# Patient Record
Sex: Male | Born: 1955 | Race: White | Hispanic: No | Marital: Single | Smoking: Former smoker
Health system: Southern US, Community
[De-identification: ages and names within clinical notes are randomized; demographics above are authoritative.]

## PROBLEM LIST (undated history)

## (undated) DIAGNOSIS — I739 Peripheral vascular disease, unspecified: Secondary | ICD-10-CM

## (undated) DIAGNOSIS — E785 Hyperlipidemia, unspecified: Secondary | ICD-10-CM

## (undated) DIAGNOSIS — I2510 Atherosclerotic heart disease of native coronary artery without angina pectoris: Secondary | ICD-10-CM

## (undated) DIAGNOSIS — K219 Gastro-esophageal reflux disease without esophagitis: Secondary | ICD-10-CM

## (undated) DIAGNOSIS — I4892 Unspecified atrial flutter: Secondary | ICD-10-CM

## (undated) DIAGNOSIS — I10 Essential (primary) hypertension: Secondary | ICD-10-CM

## (undated) DIAGNOSIS — E119 Type 2 diabetes mellitus without complications: Secondary | ICD-10-CM

## (undated) DIAGNOSIS — G4733 Obstructive sleep apnea (adult) (pediatric): Secondary | ICD-10-CM

## (undated) DIAGNOSIS — E669 Obesity, unspecified: Secondary | ICD-10-CM

## (undated) DIAGNOSIS — Z8719 Personal history of other diseases of the digestive system: Secondary | ICD-10-CM

## (undated) DIAGNOSIS — C9500 Acute leukemia of unspecified cell type not having achieved remission: Secondary | ICD-10-CM

## (undated) DIAGNOSIS — I639 Cerebral infarction, unspecified: Secondary | ICD-10-CM

## (undated) HISTORY — PX: NASAL SINUS SURGERY: SHX719

## (undated) HISTORY — DX: Type 2 diabetes mellitus without complications: E11.9

## (undated) HISTORY — DX: Atherosclerotic heart disease of native coronary artery without angina pectoris: I25.10

## (undated) HISTORY — DX: Unspecified atrial flutter: I48.92

## (undated) HISTORY — DX: Obstructive sleep apnea (adult) (pediatric): G47.33

## (undated) HISTORY — DX: Cerebral infarction, unspecified: I63.9

## (undated) HISTORY — DX: Essential (primary) hypertension: I10

## (undated) HISTORY — DX: Acute leukemia of unspecified cell type not having achieved remission: C95.00

## (undated) HISTORY — PX: GASTRIC BYPASS: SHX52

## (undated) HISTORY — DX: Hyperlipidemia, unspecified: E78.5

## (undated) HISTORY — DX: Peripheral vascular disease, unspecified: I73.9

---

## 1996-04-21 HISTORY — PX: ELBOW SURGERY: SHX618

## 2005-10-14 ENCOUNTER — Encounter: Admission: RE | Admit: 2005-10-14 | Discharge: 2005-10-14 | Payer: Self-pay

## 2013-07-20 HISTORY — PX: CARDIAC CATHETERIZATION: SHX172

## 2013-11-04 ENCOUNTER — Emergency Department: Payer: Self-pay | Admitting: Emergency Medicine

## 2013-11-04 LAB — URINALYSIS, COMPLETE
BLOOD: NEGATIVE
Bacteria: NONE SEEN
Bilirubin,UR: NEGATIVE
Ketone: NEGATIVE
NITRITE: NEGATIVE
PH: 6 (ref 4.5–8.0)
Protein: NEGATIVE
SPECIFIC GRAVITY: 1.023 (ref 1.003–1.030)

## 2013-11-04 LAB — DRUG SCREEN, URINE

## 2013-11-04 LAB — CBC
HCT: 43.6 % (ref 40.0–52.0)
HGB: 14.6 g/dL (ref 13.0–18.0)
MCH: 33.3 pg (ref 26.0–34.0)
MCHC: 33.4 g/dL (ref 32.0–36.0)
MCV: 100 fL (ref 80–100)
Platelet: 140 10*3/uL — ABNORMAL LOW (ref 150–440)
RBC: 4.37 10*6/uL — AB (ref 4.40–5.90)
RDW: 14 % (ref 11.5–14.5)
WBC: 17.3 10*3/uL — ABNORMAL HIGH (ref 3.8–10.6)

## 2013-11-04 LAB — COMPREHENSIVE METABOLIC PANEL
ALK PHOS: 67 U/L
AST: 31 U/L (ref 15–37)
Albumin: 3 g/dL — ABNORMAL LOW (ref 3.4–5.0)
Anion Gap: 6 — ABNORMAL LOW (ref 7–16)
BUN: 9 mg/dL (ref 7–18)
Bilirubin,Total: 1.3 mg/dL — ABNORMAL HIGH (ref 0.2–1.0)
CHLORIDE: 104 mmol/L (ref 98–107)
Calcium, Total: 7.9 mg/dL — ABNORMAL LOW (ref 8.5–10.1)
Co2: 26 mmol/L (ref 21–32)
Creatinine: 1.07 mg/dL (ref 0.60–1.30)
EGFR (African American): >60
GLUCOSE: 310 mg/dL — AB (ref 65–99)
Osmolality: 282 (ref 275–301)
POTASSIUM: 4 mmol/L (ref 3.5–5.1)
SGPT (ALT): 29 U/L (ref 12–78)
Sodium: 136 mmol/L (ref 136–145)
Total Protein: 6.6 g/dL (ref 6.4–8.2)

## 2013-11-04 LAB — TROPONIN I: Troponin-I: 0.03 ng/mL

## 2013-11-04 LAB — ETHANOL
Ethanol %: <0.003 % (ref 0.000–0.080)
Ethanol: <3 mg/dL

## 2014-02-07 ENCOUNTER — Encounter: Payer: Self-pay | Admitting: Internal Medicine

## 2014-02-07 ENCOUNTER — Encounter: Payer: Self-pay | Admitting: *Deleted

## 2014-02-07 ENCOUNTER — Emergency Department: Payer: Self-pay | Admitting: Student

## 2014-02-07 ENCOUNTER — Ambulatory Visit (INDEPENDENT_AMBULATORY_CARE_PROVIDER_SITE_OTHER): Payer: Non-veteran care | Admitting: Internal Medicine

## 2014-02-07 ENCOUNTER — Encounter (INDEPENDENT_AMBULATORY_CARE_PROVIDER_SITE_OTHER): Payer: Self-pay

## 2014-02-07 VITALS — BP 140/82 | HR 77 | Ht 72.0 in | Wt 345.2 lb

## 2014-02-07 DIAGNOSIS — I4892 Unspecified atrial flutter: Secondary | ICD-10-CM

## 2014-02-07 LAB — COMPREHENSIVE METABOLIC PANEL
ALK PHOS: 75 U/L
ALT: 28 U/L
AST: 26 U/L (ref 15–37)
Albumin: 3.2 g/dL — ABNORMAL LOW (ref 3.4–5.0)
Anion Gap: 7 (ref 7–16)
BUN: 7 mg/dL (ref 7–18)
Bilirubin,Total: 0.5 mg/dL (ref 0.2–1.0)
CALCIUM: 8.3 mg/dL — AB (ref 8.5–10.1)
CO2: 31 mmol/L (ref 21–32)
Chloride: 103 mmol/L (ref 98–107)
Creatinine: 1.08 mg/dL (ref 0.60–1.30)
EGFR (African American): >60
EGFR (Non-African Amer.): >60
Glucose: 122 mg/dL — ABNORMAL HIGH (ref 65–99)
Osmolality: 281 (ref 275–301)
POTASSIUM: 4.1 mmol/L (ref 3.5–5.1)
SODIUM: 141 mmol/L (ref 136–145)
Total Protein: 6.9 g/dL (ref 6.4–8.2)

## 2014-02-07 LAB — CBC
HCT: 46.4 % (ref 40.0–52.0)
HGB: 15.4 g/dL (ref 13.0–18.0)
MCH: 32.5 pg (ref 26.0–34.0)
MCHC: 33.2 g/dL (ref 32.0–36.0)
MCV: 98 fL (ref 80–100)
Platelet: 153 10*3/uL (ref 150–440)
RBC: 4.75 10*6/uL (ref 4.40–5.90)
RDW: 14.6 % — AB (ref 11.5–14.5)
WBC: 20 10*3/uL — AB (ref 3.8–10.6)

## 2014-02-07 LAB — PROTIME-INR
INR: 3.2
PROTHROMBIN TIME: 31.9 s — AB (ref 11.5–14.7)

## 2014-02-07 LAB — TROPONIN I
Troponin-I: <0.02 ng/mL
Troponin-I: <0.02 ng/mL

## 2014-02-07 LAB — APTT: ACTIVATED PTT: 44 s — AB (ref 23.6–35.9)

## 2014-02-07 MED ORDER — FUROSEMIDE 80 MG PO TABS: 80.0000 mg | ORAL_TABLET | Freq: Every day | ORAL | Status: DC

## 2014-02-07 NOTE — Patient Instructions (Addendum)
Your physician has requested that you have an echocardiogram. Echocardiography is a painless test that uses sound waves to create images of your heart. It provides your doctor with information about the size and shape of your heart and how well your heart's chambers and valves are working. This procedure takes approximately one hour. There are no restrictions for this procedure.  Your physician has recommended that you have an ablation. Catheter ablation is a medical procedure used to treat some cardiac arrhythmias (irregular heartbeats). During catheter ablation, a long, thin, flexible tube is put into a blood vessel in your groin (upper thigh), or neck. This tube is called an ablation catheter. It is then guided to your heart through the blood vessel. Radio frequency waves destroy small areas of heart tissue where abnormal heartbeats may cause an arrhythmia to start. Please see the instruction sheet given to you today. We will contact you to set this up.   Your physician has recommended you make the following change in your medication:  Increase Lasix to 80 mg once daily   We will contact you about changing your blood thinner

## 2014-02-07 NOTE — Progress Notes (Signed)
ELECTROPHYSIOLOGY CONSULT NOTE  Patient ID: Jeffrey Hancock, MRN: [id number], DOB/AGE: Mar 29, 1956 58 y.o. Admit date: (Not on file) Date of Consult: 02/07/2014  Primary Physician: Hilbert Corrigan, MD Primary Cardiologist: [facility]  Chief Complaint: atrial flutter   HPI Maurisio Cicalese is a 58 y.o. male  With a very complicated past medical history referred from the [place] for consideration of flutter ablation because of challenges in getting it done at time sensitive way.  He was initially diagnosed with atrial flutter in spring 2015. There are interval tracings and Holter monitor demonstrating sinus rhythm. He presents again today with atrial flutter but is clearly paroxysmal. He is unaware of it at this point although he thinks he is aware of it last night. ECGs and recorders had demonstrated nonsustained atrial tachycardia and frequent PACs and so there has been a question raised as to the value of flutter ablation given the relatively high likelihood of ensuing atrial fibrillation.  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 For this is been treated with antiplatelet therapy.  Upon identification of the atrial flutter the summer it was recommended that he begin on Coumadin. It was his desire that he go on a NOAC. However, this was not approved both because of concerns of prior gastric bypass as well as potentially the issue of atrial flutter not been approved indication for NOAC therapy. The notes from [place] are replete with confrontation related to medications, procedures and differences of opinion related to treatment options.  He is markedly limited in his ability to get around, with dyspnea at less than 100 feet. He has some peripheral edema. He has CPAP therapy for sleep apnea which is used for greater than 10 years.  As noted he underwent gastric bypass surgery and his weight is down 130  pounds from high 400s--mid 300s.  He continues with diabetes and hypertension.  Also, he apparently recently has been diagnosed with leukemia chemotherapy which has been delayed because of ongoing use of warfarin. These records are not available thru [facility].      Past Medical History  Diagnosis Date  . Hypertension   . Stroke   . Hyperlipidemia   . Diabetes mellitus without complication   . CVA (cerebral infarction)   . PVD (peripheral vascular disease)   . Atrial flutter, paroxysmal   . OSA (obstructive sleep apnea)   . Palpitations   . Arrhythmia     a-flutter  . Cancer   . Acute leukemia   . Coronary artery disease       Surgical History:  Past Surgical History  Procedure Laterality Date  . Gastric bypass    . Cardiac catheterization  07/2013     Home Meds: Prior to Admission medications   Medication Sig Start Date End Date Taking? Authorizing Provider  amLODipine (NORVASC) 10 MG tablet Take 10 mg by mouth daily.   Yes Historical Provider, MD  aspirin 81 MG tablet Take 81 mg by mouth daily.   Yes Historical Provider, MD  atenolol (TENORMIN) 50 MG tablet Take 50 mg by mouth daily.   Yes Historical Provider, MD  atorvastatin (LIPITOR) 10 MG tablet Take 10 mg by mouth daily.   Yes Historical Provider, MD  DULoxetine HCl 40 MG CPEP Take by mouth daily.   Yes Historical Provider, MD  furosemide (LASIX) 40 MG tablet Take 40 mg by mouth.   Yes Historical  Provider, MD  lisinopril (PRINIVIL,ZESTRIL) 40 MG tablet Take 40 mg by mouth daily.   Yes Historical Provider, MD  oxycodone (OXY-IR) 5 MG capsule Take 10 mg by mouth 2 (two) times daily.   Yes Historical Provider, MD  traZODone (DESYREL) 100 MG tablet Take 100 mg by mouth at bedtime.   Yes Historical Provider, MD  warfarin (COUMADIN) 5 MG tablet Take as directed   Yes Historical Provider, MD      Allergies:  Allergies  Allergen Reactions  . Codeine   . Isordil [Isosorbide]     Hives   . Nifedipine      History   Social History  . Marital Status: Unknown    Spouse Name: N/A    Number of Children: N/A  . Years of Education: N/A   Occupational History  . Not on file.   Social History Main Topics  . Smoking status: Former Smoker -- 1.00 packs/day for 10 years    Types: Cigarettes    Quit date: 02/07/2001  . Smokeless tobacco: Not on file  . Alcohol Use: No  . Drug Use: No  . Sexual Activity: Not on file   Other Topics Concern  . Not on file   Social History Narrative  . No narrative on file     Family History  Problem Relation Age of Onset  . Family history unknown: Yes     ROS:  Please see the history of present illness.     All other systems reviewed and negative.    Physical Exam:   Blood pressure 140/82, pulse 77, height 6' (1.829 m), weight 345 lb 4 oz (156.604 kg). General: Well developed, wmorbidly obese male in no acute distress. Head: Normocephalic, atraumatic, sclera non-icteric, no xanthomas, nares are without discharge. EENT: normal Lymph Nodes:  none Back: without scoliosis/kyphosis, no CVA tendersness Neck: Negative for carotid bruits.JVP 8-10 Lungs: Clear bilaterally to auscultation without wheezes, rales, or rhonchi. Breathing is unlabored. Heart: Irregularly irregular rate and rhythm with a 2/6 murmur , rubs, or gallops appreciated. Abdomen: Soft, mild right upper quadrant tenderness, non-distended with normoactive bowel sounds. No hepatomegaly. No rebound/guarding. No obvious abdominal masses. Msk:  Strength and tone appear normal for age. Extremities: No clubbing or cyanosis.  2+ edema.  Distal pedal pulses are 2+ and equal bilaterally. Skin: Warm and Dry Neuro: Alert and oriented X 3. CN III-XII intact Grossly normal sensory and motor function . Psych:  Responds to questions appropriately with a normal affect.        Radiology/Studies:  No results found.  EKG:  Atrial flutter with variable block   Assessment and Plan:  Atrial  flutter persisted with variable block  HFpEF  Cerebellar stroke  Diabetes/hypertension  History of gastric bypass  Leukemia (?)  The patient has a complex history and has atrial flutter that is intermittent, Holter monitoring has demonstrated runs of nonsustained atrial tachycardia and ECGs with frequent PACs both of which are likely harm to her atrial fibrillation,, a CHADS-VASc score of greater than or equal to 5 including a prior stroke and symptoms for which to his atrial flutter. Treatment of his leukemia however, is on hold because of his anticoagulation. These records are not available in the one that were reviewed from [facility].  It is likely that he will have recurrence of atrial fibrillation. It is also true, then my estimation, with his prior stroke he would be a long-term candidate for anticoagulation and not withstanding successful ablation of his flutter substrate.  There has apparently been some disagreement about this issue at [facility] according to the records.   His anticoagulation has also been very difficult to manage; his most recent INR is greater than 5. With his history of gastric bypass, he is not a candidate dabigitran. I wonder whether however, he would be a candidate for a Xa blocker. I will check into this.    Virl Axe

## 2014-02-09 ENCOUNTER — Inpatient Hospital Stay: Payer: Self-pay | Admitting: Internal Medicine

## 2014-02-09 ENCOUNTER — Telehealth: Payer: Self-pay | Admitting: *Deleted

## 2014-02-09 ENCOUNTER — Ambulatory Visit (INDEPENDENT_AMBULATORY_CARE_PROVIDER_SITE_OTHER): Payer: Medicare Other | Admitting: Physician Assistant

## 2014-02-09 ENCOUNTER — Encounter: Payer: Self-pay | Admitting: Physician Assistant

## 2014-02-09 VITALS — BP 218/116 | HR 94 | Ht 72.0 in | Wt 345.0 lb

## 2014-02-09 DIAGNOSIS — R0602 Shortness of breath: Secondary | ICD-10-CM

## 2014-02-09 DIAGNOSIS — I4892 Unspecified atrial flutter: Secondary | ICD-10-CM

## 2014-02-09 LAB — COMPREHENSIVE METABOLIC PANEL
ALK PHOS: 71 U/L
Albumin: 3.3 g/dL — ABNORMAL LOW (ref 3.4–5.0)
Anion Gap: 7 (ref 7–16)
BILIRUBIN TOTAL: 0.5 mg/dL (ref 0.2–1.0)
BUN: 10 mg/dL (ref 7–18)
CO2: 31 mmol/L (ref 21–32)
Calcium, Total: 8.3 mg/dL — ABNORMAL LOW (ref 8.5–10.1)
Chloride: 100 mmol/L (ref 98–107)
Creatinine: 1.1 mg/dL (ref 0.60–1.30)
EGFR (African American): >60
GLUCOSE: 174 mg/dL — AB (ref 65–99)
OSMOLALITY: 279 (ref 275–301)
POTASSIUM: 3.9 mmol/L (ref 3.5–5.1)
SGOT(AST): 28 U/L (ref 15–37)
SGPT (ALT): 29 U/L
Sodium: 138 mmol/L (ref 136–145)
TOTAL PROTEIN: 7.2 g/dL (ref 6.4–8.2)

## 2014-02-09 LAB — CBC
HCT: 46.6 % (ref 40.0–52.0)
HGB: 15.3 g/dL (ref 13.0–18.0)
MCH: 32 pg (ref 26.0–34.0)
MCHC: 32.9 g/dL (ref 32.0–36.0)
MCV: 97 fL (ref 80–100)
PLATELETS: 151 10*3/uL (ref 150–440)
RBC: 4.79 10*6/uL (ref 4.40–5.90)
RDW: 15 % — ABNORMAL HIGH (ref 11.5–14.5)
WBC: 18.5 10*3/uL — ABNORMAL HIGH (ref 3.8–10.6)

## 2014-02-09 LAB — APTT
ACTIVATED PTT: 45.7 s — AB (ref 23.6–35.9)
Activated PTT: 70.6 secs — ABNORMAL HIGH (ref 23.6–35.9)

## 2014-02-09 LAB — PROTIME-INR
INR: 2.7
Prothrombin Time: 27.6 secs — ABNORMAL HIGH (ref 11.5–14.7)

## 2014-02-09 LAB — TROPONIN I: Troponin-I: <0.02 ng/mL

## 2014-02-09 LAB — HEPARIN LEVEL (UNFRACTIONATED): Anti-Xa(Unfractionated): 0.12 IU/mL — ABNORMAL LOW (ref 0.30–0.70)

## 2014-02-09 NOTE — Telephone Encounter (Signed)
Patient is in the clinic and wants to talk about his care.

## 2014-02-09 NOTE — Progress Notes (Signed)
    Patient of Dr. Caryl Comes who came to the office to drop off the name of his chemotherapy medication (Imatinib).   Patient diagnosed with atrial flutter in spring of 2015. Holter monitor showed paroxysmal atrial flutter. He has also recently been diagnosed with leukemia. Chemotherapy has been delayed 2/2 ongoing use of warfarin. Planning for possible atrial flutter ablation. There is some question regarding his chemotherapy treatment status regarding anticoagulation.   He presented today to drop off the name of his chemotherapy medication (Imatinib). Upon his arrival in the office lobby he was noted by the check-in staff to be SOB and fatigued. The clinical staff was called to evaluate the patient. EMS was called to transport the patient. He was brought to the back via wheelchair. EKG showed atrial flutter. BP 218/116. He did take his medications this AM. He denied any chest pains palpitations, or diaphoresis. Upon EMS arrival he was feeling better. EMS arrived prior to [place] started a peripheral IV. No medication was administered in office 2/2 prompt EMS response. He was transported to the hospital for further evaluation.   Christell Faith, PA-C 02/09/2014 4:33 PM

## 2014-02-09 NOTE — Telephone Encounter (Signed)
Patient placed on [employer] PA schedule

## 2014-02-10 ENCOUNTER — Other Ambulatory Visit: Payer: Self-pay | Admitting: Physician Assistant

## 2014-02-10 ENCOUNTER — Telehealth: Payer: Self-pay | Admitting: *Deleted

## 2014-02-10 DIAGNOSIS — I341 Nonrheumatic mitral (valve) prolapse: Secondary | ICD-10-CM | POA: Diagnosis not present

## 2014-02-10 DIAGNOSIS — R079 Chest pain, unspecified: Secondary | ICD-10-CM

## 2014-02-10 DIAGNOSIS — I4892 Unspecified atrial flutter: Secondary | ICD-10-CM

## 2014-02-10 LAB — LIPID PANEL
Cholesterol: 141 mg/dL (ref 0–200)
HDL Cholesterol: 30 mg/dL — ABNORMAL LOW (ref 40–60)
Ldl Cholesterol, Calc: 69 mg/dL (ref 0–100)
Triglycerides: 212 mg/dL — ABNORMAL HIGH (ref 0–200)
VLDL Cholesterol, Calc: 42 mg/dL — ABNORMAL HIGH (ref 5–40)

## 2014-02-10 LAB — CK-MB
CK-MB: 3.6 ng/mL (ref 0.5–3.6)
CK-MB: 3.1 ng/mL (ref 0.5–3.6)
CK-MB: 3 ng/mL (ref 0.5–3.6)

## 2014-02-10 LAB — CBC WITH DIFFERENTIAL/PLATELET
Bands: 2 %
EOS PCT: 2 %
HCT: 45.2 % (ref 40.0–52.0)
HGB: 14.2 g/dL (ref 13.0–18.0)
LYMPHS PCT: 13 %
MCH: 31.4 pg (ref 26.0–34.0)
MCHC: 31.4 g/dL — ABNORMAL LOW (ref 32.0–36.0)
MCV: 100 fL (ref 80–100)
MYELOCYTE: 2 %
Metamyelocyte: 7 %
Monocytes: 6 %
NRBC/100 WBC: 1 /
Platelet: 135 10*3/uL — ABNORMAL LOW (ref 150–440)
RBC: 4.52 10*6/uL (ref 4.40–5.90)
RDW: 14.8 % — AB (ref 11.5–14.5)
SEGMENTED NEUTROPHILS: 68 %
WBC: 15.1 10*3/uL — AB (ref 3.8–10.6)

## 2014-02-10 LAB — MAGNESIUM: MAGNESIUM: 1.9 mg/dL

## 2014-02-10 LAB — HEPARIN LEVEL (UNFRACTIONATED): ANTI-XA(UNFRACTIONATED): 0.39 [IU]/mL (ref 0.30–0.70)

## 2014-02-10 LAB — TSH: THYROID STIMULATING HORM: 0.951 u[IU]/mL

## 2014-02-10 LAB — TROPONIN I: Troponin-I: <0.02 ng/mL

## 2014-02-10 NOTE — Telephone Encounter (Signed)
Please call patient. He has several questions regarding ablation.

## 2014-02-10 NOTE — Telephone Encounter (Signed)
LVM @ 1023

## 2014-02-12 LAB — CULTURE, BLOOD (SINGLE)

## 2014-02-13 ENCOUNTER — Ambulatory Visit (INDEPENDENT_AMBULATORY_CARE_PROVIDER_SITE_OTHER): Payer: Non-veteran care | Admitting: *Deleted

## 2014-02-13 ENCOUNTER — Encounter (HOSPITAL_COMMUNITY): Payer: Self-pay | Admitting: Pharmacy Technician

## 2014-02-13 VITALS — BP 142/82 | HR 74 | Ht 72.0 in | Wt 336.8 lb

## 2014-02-13 DIAGNOSIS — I4892 Unspecified atrial flutter: Secondary | ICD-10-CM

## 2014-02-13 MED ORDER — RIVAROXABAN 20 MG PO TABS: 20.0000 mg | ORAL_TABLET | Freq: Every day | ORAL | Status: DC

## 2014-02-13 NOTE — Patient Instructions (Addendum)
Your physician has recommended you make the following change in your medication:  Stop Aspirin today  Stop Coumadin today   Start Xarelto 20 mg once daily tomorrow     Your ablation will be 02/16/14 at 0530 am  Please follow instructions provided in letter

## 2014-02-13 NOTE — Progress Notes (Signed)
Patients INR is 3.3  Stop Coumadin ans aspirin per verbal order from Dr. Caryl Comes  Start Xarelto 20 mg once daily   Patient scheduled for ablation 02/16/14 at 0730 am

## 2014-02-13 NOTE — Telephone Encounter (Signed)
See nurse visit 10/26

## 2014-02-14 ENCOUNTER — Telehealth: Payer: Self-pay | Admitting: Internal Medicine

## 2014-02-14 NOTE — Telephone Encounter (Signed)
VA needs needs Auth for the surgery, needs to go on web cite and get the Auth form  [email or web address]   Box that has [employer] program Under neath will list the provider Down left hand side "forms and packets" Second one down, request for additional services.   Fax number: [phone]

## 2014-02-15 ENCOUNTER — Telehealth: Payer: Self-pay

## 2014-02-15 ENCOUNTER — Telehealth: Payer: Self-pay | Admitting: *Deleted

## 2014-02-15 NOTE — Telephone Encounter (Signed)
  Anti Coag with [employer] called and states he has some questions regarding Xarelto given to pt by [place] , states pt has had gastric bypass and this med will not be effective. Please call. If after 12 call [phone]

## 2014-02-15 NOTE — Telephone Encounter (Signed)
Pt would like to know what medications he should not take for his procedure in  The morning. Please call.

## 2014-02-15 NOTE — Telephone Encounter (Signed)
Form printed and placed in Dr. Aquilla Hacker folder to sign

## 2014-02-15 NOTE — Telephone Encounter (Signed)
VA wanted to make sure it is all right for patient to be on Xarelto given that he has had a gastric sleeve procedure

## 2014-02-15 NOTE — Telephone Encounter (Signed)
LVM @ 1028

## 2014-02-15 NOTE — Telephone Encounter (Signed)
Patient came in clinic today to pick up instructions for ablation and RX  He appeared diaphoretic and mildly short of breath  He stated he felt no worse than usual  His vital signs were obtained:  O2 96%, BP 212/108, Temp 98.1, HR 80-110 irregular  I advised him to go to the ED or let us contact EMS if he felt he was in distress  Patient refused he stated that his ablation is tomorrow morning and he was going to go home and rest until that time   I discussed patient symptoms and vitals with Dr. Rockey Situ  Patient agrees to take extra atenolol 50 mg once if his pressure and heart rate remain elevated at home  Patient agrees to contact EMS her he begins feeling worse

## 2014-02-15 NOTE — Telephone Encounter (Signed)
Reviewed medication instructions with patient Take all meds as ordered except diabetes medications the am of the procedure   Patient verbalized understanding

## 2014-02-15 NOTE — Telephone Encounter (Signed)
Anti Coag with [employer] called and states he has some questions regarding Xarelto given to pt by [place] , states pt has had gastric bypass and this med will not be effective. Please call.  If after 12 call x [phone]

## 2014-02-15 NOTE — Telephone Encounter (Signed)
Informed patient that Xarelto RX is at front desk for pick up

## 2014-02-16 ENCOUNTER — Encounter (HOSPITAL_COMMUNITY): Payer: Self-pay | Admitting: Certified Registered"

## 2014-02-16 ENCOUNTER — Encounter (HOSPITAL_COMMUNITY): Payer: No Typology Code available for payment source | Admitting: Anesthesiology

## 2014-02-16 ENCOUNTER — Ambulatory Visit (HOSPITAL_COMMUNITY)
Admission: RE | Admit: 2014-02-16 | Discharge: 2014-02-17 | Disposition: A | Discharge status: Home / Self Care | Payer: No Typology Code available for payment source | Source: Ambulatory Visit | Attending: Internal Medicine | Admitting: Internal Medicine

## 2014-02-16 ENCOUNTER — Ambulatory Visit (HOSPITAL_COMMUNITY): Payer: No Typology Code available for payment source | Admitting: Anesthesiology

## 2014-02-16 ENCOUNTER — Encounter (HOSPITAL_COMMUNITY)
Admission: RE | Disposition: A | Discharge status: Home / Self Care | Payer: Self-pay | Source: Ambulatory Visit | Attending: Internal Medicine

## 2014-02-16 DIAGNOSIS — I10 Essential (primary) hypertension: Secondary | ICD-10-CM | POA: Diagnosis not present

## 2014-02-16 DIAGNOSIS — Z8673 Personal history of transient ischemic attack (TIA), and cerebral infarction without residual deficits: Secondary | ICD-10-CM | POA: Insufficient documentation

## 2014-02-16 DIAGNOSIS — K219 Gastro-esophageal reflux disease without esophagitis: Secondary | ICD-10-CM | POA: Diagnosis not present

## 2014-02-16 DIAGNOSIS — I2510 Atherosclerotic heart disease of native coronary artery without angina pectoris: Secondary | ICD-10-CM | POA: Diagnosis not present

## 2014-02-16 DIAGNOSIS — E119 Type 2 diabetes mellitus without complications: Secondary | ICD-10-CM

## 2014-02-16 DIAGNOSIS — C9500 Acute leukemia of unspecified cell type not having achieved remission: Secondary | ICD-10-CM | POA: Diagnosis present

## 2014-02-16 DIAGNOSIS — Z7901 Long term (current) use of anticoagulants: Secondary | ICD-10-CM | POA: Insufficient documentation

## 2014-02-16 DIAGNOSIS — E669 Obesity, unspecified: Secondary | ICD-10-CM | POA: Diagnosis present

## 2014-02-16 DIAGNOSIS — E785 Hyperlipidemia, unspecified: Secondary | ICD-10-CM | POA: Diagnosis present

## 2014-02-16 DIAGNOSIS — Z9884 Bariatric surgery status: Secondary | ICD-10-CM | POA: Insufficient documentation

## 2014-02-16 DIAGNOSIS — Z6841 Body Mass Index (BMI) 40.0 and over, adult: Secondary | ICD-10-CM | POA: Insufficient documentation

## 2014-02-16 DIAGNOSIS — I4892 Unspecified atrial flutter: Secondary | ICD-10-CM | POA: Diagnosis not present

## 2014-02-16 DIAGNOSIS — Z7982 Long term (current) use of aspirin: Secondary | ICD-10-CM | POA: Insufficient documentation

## 2014-02-16 DIAGNOSIS — Z87891 Personal history of nicotine dependence: Secondary | ICD-10-CM | POA: Insufficient documentation

## 2014-02-16 DIAGNOSIS — I639 Cerebral infarction, unspecified: Secondary | ICD-10-CM | POA: Diagnosis present

## 2014-02-16 DIAGNOSIS — G4733 Obstructive sleep apnea (adult) (pediatric): Secondary | ICD-10-CM | POA: Diagnosis present

## 2014-02-16 DIAGNOSIS — I517 Cardiomegaly: Secondary | ICD-10-CM | POA: Diagnosis not present

## 2014-02-16 HISTORY — PX: ATRIAL FLUTTER ABLATION: SHX5733

## 2014-02-16 HISTORY — DX: Obesity, unspecified: E66.9

## 2014-02-16 HISTORY — DX: Gastro-esophageal reflux disease without esophagitis: K21.9

## 2014-02-16 HISTORY — PX: ABLATION OF DYSRHYTHMIC FOCUS: SHX254

## 2014-02-16 HISTORY — DX: Personal history of other diseases of the digestive system: Z87.19

## 2014-02-16 LAB — BASIC METABOLIC PANEL
Anion gap: 13 (ref 5–15)
BUN: 11 mg/dL (ref 6–23)
CO2: 28 mEq/L (ref 19–32)
Calcium: 9.4 mg/dL (ref 8.4–10.5)
Chloride: 99 mEq/L (ref 96–112)
Creatinine, Ser: 0.97 mg/dL (ref 0.50–1.35)
GFR calc Af Amer: >90 mL/min (ref >90)
GFR calc non Af Amer: >90 mL/min (ref >90)
Glucose, Bld: 92 mg/dL (ref 70–99)
Potassium: 3.9 mEq/L (ref 3.7–5.3)
Sodium: 140 mEq/L (ref 137–147)

## 2014-02-16 LAB — GLUCOSE, CAPILLARY
GLUCOSE-CAPILLARY: 145 mg/dL — AB (ref 70–99)
Glucose-Capillary: 94 mg/dL (ref 70–99)
Glucose-Capillary: 130 mg/dL — ABNORMAL HIGH (ref 70–99)
Glucose-Capillary: 152 mg/dL — ABNORMAL HIGH (ref 70–99)
Glucose-Capillary: 201 mg/dL — ABNORMAL HIGH (ref 70–99)

## 2014-02-16 LAB — CBC
HCT: 46 % (ref 39.0–52.0)
HEMOGLOBIN: 15.6 g/dL (ref 13.0–17.0)
MCH: 32.4 pg (ref 26.0–34.0)
MCHC: 33.9 g/dL (ref 30.0–36.0)
MCV: 95.6 fL (ref 78.0–100.0)
Platelets: 182 10*3/uL (ref 150–400)
RBC: 4.81 MIL/uL (ref 4.22–5.81)
RDW: 14.4 % (ref 11.5–15.5)
WBC: 24 10*3/uL — ABNORMAL HIGH (ref 4.0–10.5)

## 2014-02-16 LAB — PROTIME-INR
INR: 2.01 — ABNORMAL HIGH (ref 0.00–1.49)
Prothrombin Time: 23 seconds — ABNORMAL HIGH (ref 11.6–15.2)

## 2014-02-16 SURGERY — ATRIAL FLUTTER ABLATION
Anesthesia: General

## 2014-02-16 MED ORDER — LISINOPRIL 40 MG PO TABS
40.0000 mg | ORAL_TABLET | Freq: Every day | ORAL | Status: DC
  Administered 2014-02-17: 40 mg via ORAL
  Filled 2014-02-16: qty 1

## 2014-02-16 MED ORDER — SODIUM CHLORIDE 0.9 % IJ SOLN
3.0000 mL | Freq: Two times a day (BID) | INTRAMUSCULAR | Status: DC
  Administered 2014-02-16: 3 mL via INTRAVENOUS

## 2014-02-16 MED ORDER — DULOXETINE HCL 20 MG PO CPEP
40.0000 mg | ORAL_CAPSULE | Freq: Every day | ORAL | Status: DC
  Administered 2014-02-17: 11:00:00 40 mg via ORAL
  Filled 2014-02-16: qty 2

## 2014-02-16 MED ORDER — ONDANSETRON HCL 4 MG/2ML IJ SOLN
INTRAMUSCULAR | Status: AC
  Filled 2014-02-16: qty 2

## 2014-02-16 MED ORDER — TRAZODONE HCL 100 MG PO TABS
100.0000 mg | ORAL_TABLET | Freq: Every day | ORAL | Status: DC
  Administered 2014-02-16: 22:00:00 100 mg via ORAL
  Filled 2014-02-16 (×2): qty 1

## 2014-02-16 MED ORDER — SODIUM CHLORIDE 0.9 % IV SOLN
INTRAVENOUS | Status: DC | PRN
  Administered 2014-02-16: 07:00:00 via INTRAVENOUS

## 2014-02-16 MED ORDER — ONDANSETRON HCL 4 MG/2ML IJ SOLN: 4.0000 mg | Freq: Four times a day (QID) | INTRAMUSCULAR | Status: DC | PRN

## 2014-02-16 MED ORDER — MORPHINE SULFATE 4 MG/ML IJ SOLN
4.0000 mg | Freq: Once | INTRAMUSCULAR | Status: AC
  Administered 2014-02-16: 4 mg via INTRAVENOUS

## 2014-02-16 MED ORDER — DOCUSATE SODIUM 100 MG PO CAPS
400.0000 mg | ORAL_CAPSULE | Freq: Every day | ORAL | Status: DC
  Administered 2014-02-16: 400 mg via ORAL
  Filled 2014-02-16 (×2): qty 4

## 2014-02-16 MED ORDER — ROCURONIUM BROMIDE 100 MG/10ML IV SOLN
INTRAVENOUS | Status: DC | PRN
Start: 2014-02-16 — End: 2014-02-16
  Administered 2014-02-16: 30 mg via INTRAVENOUS

## 2014-02-16 MED ORDER — CARVEDILOL 12.5 MG PO TABS
12.5000 mg | ORAL_TABLET | Freq: Two times a day (BID) | ORAL | Status: DC
  Administered 2014-02-16 – 2014-02-17 (×2): 12.5 mg via ORAL
  Filled 2014-02-16 (×4): qty 1

## 2014-02-16 MED ORDER — ONDANSETRON HCL 4 MG/2ML IJ SOLN
4.0000 mg | Freq: Once | INTRAMUSCULAR | Status: AC
  Administered 2014-02-16: 4 mg via INTRAVENOUS

## 2014-02-16 MED ORDER — SUCCINYLCHOLINE CHLORIDE 20 MG/ML IJ SOLN
INTRAMUSCULAR | Status: DC | PRN
  Administered 2014-02-16: 100 mg via INTRAVENOUS

## 2014-02-16 MED ORDER — HEPARIN (PORCINE) IN NACL 2-0.9 UNIT/ML-% IJ SOLN
INTRAMUSCULAR | Status: AC
  Filled 2014-02-16: qty 500

## 2014-02-16 MED ORDER — OXYCODONE HCL 5 MG PO TABS
10.0000 mg | ORAL_TABLET | Freq: Two times a day (BID) | ORAL | Status: DC
  Administered 2014-02-16 – 2014-02-17 (×3): 10 mg via ORAL
  Filled 2014-02-16 (×3): qty 2

## 2014-02-16 MED ORDER — AMLODIPINE BESYLATE 10 MG PO TABS
10.0000 mg | ORAL_TABLET | Freq: Every morning | ORAL | Status: DC
Start: 2014-02-17 — End: 2014-02-17
  Administered 2014-02-17: 10 mg via ORAL
  Filled 2014-02-16: qty 1

## 2014-02-16 MED ORDER — NEOSTIGMINE METHYLSULFATE 10 MG/10ML IV SOLN
INTRAVENOUS | Status: DC | PRN
  Administered 2014-02-16: 3 mg via INTRAVENOUS

## 2014-02-16 MED ORDER — FENTANYL CITRATE 0.05 MG/ML IJ SOLN
INTRAMUSCULAR | Status: DC | PRN
  Administered 2014-02-16: 150 ug via INTRAVENOUS

## 2014-02-16 MED ORDER — MIDAZOLAM HCL 5 MG/5ML IJ SOLN
INTRAMUSCULAR | Status: DC | PRN
Start: 2014-02-16 — End: 2014-02-16
  Administered 2014-02-16: 2 mg via INTRAVENOUS

## 2014-02-16 MED ORDER — FUROSEMIDE 80 MG PO TABS
80.0000 mg | ORAL_TABLET | Freq: Every day | ORAL | Status: DC
  Administered 2014-02-17: 80 mg via ORAL
  Filled 2014-02-16: qty 1

## 2014-02-16 MED ORDER — MORPHINE SULFATE 2 MG/ML IJ SOLN
INTRAMUSCULAR | Status: AC
  Filled 2014-02-16: qty 1

## 2014-02-16 MED ORDER — PROMETHAZINE HCL 25 MG/ML IJ SOLN: 6.2500 mg | INTRAMUSCULAR | Status: DC | PRN

## 2014-02-16 MED ORDER — LIDOCAINE HCL (CARDIAC) 20 MG/ML IV SOLN
INTRAVENOUS | Status: DC | PRN
  Administered 2014-02-16: 100 mg via INTRAVENOUS

## 2014-02-16 MED ORDER — GLYCOPYRROLATE 0.2 MG/ML IJ SOLN
INTRAMUSCULAR | Status: DC | PRN
  Administered 2014-02-16: 0.4 mg via INTRAVENOUS

## 2014-02-16 MED ORDER — BACITRACIN-NEOMYCIN-POLYMYXIN OINTMENT TUBE
TOPICAL_OINTMENT | CUTANEOUS | Status: DC | PRN
  Administered 2014-02-16: 1 via TOPICAL
  Filled 2014-02-16: qty 15

## 2014-02-16 MED ORDER — ONDANSETRON HCL 4 MG/2ML IJ SOLN
INTRAMUSCULAR | Status: DC | PRN
  Administered 2014-02-16: 4 mg via INTRAVENOUS

## 2014-02-16 MED ORDER — MORPHINE SULFATE 2 MG/ML IJ SOLN
2.0000 mg | Freq: Once | INTRAMUSCULAR | Status: AC
  Administered 2014-02-16: 18:00:00 2 mg via INTRAVENOUS

## 2014-02-16 MED ORDER — SODIUM CHLORIDE 0.9 % IV SOLN: 250.0000 mL | INTRAVENOUS | Status: DC | PRN

## 2014-02-16 MED ORDER — ACETAMINOPHEN 325 MG PO TABS: 650.0000 mg | ORAL_TABLET | ORAL | Status: DC | PRN

## 2014-02-16 MED ORDER — SODIUM CHLORIDE 0.9 % IJ SOLN: 3.0000 mL | INTRAMUSCULAR | Status: DC | PRN

## 2014-02-16 MED ORDER — MORPHINE SULFATE 4 MG/ML IJ SOLN
INTRAMUSCULAR | Status: AC
  Filled 2014-02-16: qty 1

## 2014-02-16 MED ORDER — HYDROMORPHONE HCL 1 MG/ML IJ SOLN: 0.2500 mg | INTRAMUSCULAR | Status: DC | PRN

## 2014-02-16 MED ORDER — PROPOFOL 10 MG/ML IV BOLUS
INTRAVENOUS | Status: DC | PRN
  Administered 2014-02-16: 200 mg via INTRAVENOUS

## 2014-02-16 MED ORDER — OFF THE BEAT BOOK
Freq: Once | Status: AC
  Administered 2014-02-16: 22:00:00
  Filled 2014-02-16: qty 1

## 2014-02-16 MED ORDER — BUPIVACAINE HCL (PF) 0.25 % IJ SOLN
INTRAMUSCULAR | Status: AC
  Filled 2014-02-16: qty 30

## 2014-02-16 MED ORDER — POTASSIUM CHLORIDE CRYS ER 20 MEQ PO TBCR
40.0000 meq | EXTENDED_RELEASE_TABLET | Freq: Every morning | ORAL | Status: DC
  Administered 2014-02-17: 11:00:00 40 meq via ORAL
  Filled 2014-02-16: qty 2

## 2014-02-16 MED ORDER — INSULIN ASPART PROT & ASPART (70-30 MIX) 100 UNIT/ML ~~LOC~~ SUSP
35.0000 [IU] | Freq: Two times a day (BID) | SUBCUTANEOUS | Status: DC
  Administered 2014-02-16 – 2014-02-17 (×2): 35 [IU] via SUBCUTANEOUS
  Filled 2014-02-16: qty 10

## 2014-02-16 MED ORDER — ATORVASTATIN CALCIUM 10 MG PO TABS
10.0000 mg | ORAL_TABLET | Freq: Every day | ORAL | Status: DC
  Administered 2014-02-16: 10 mg via ORAL
  Filled 2014-02-16 (×2): qty 1

## 2014-02-16 MED ORDER — RIVAROXABAN 20 MG PO TABS
20.0000 mg | ORAL_TABLET | Freq: Every day | ORAL | Status: DC
Start: 2014-02-16 — End: 2014-02-17
  Administered 2014-02-16: 18:00:00 20 mg via ORAL
  Filled 2014-02-16 (×2): qty 1

## 2014-02-16 NOTE — H&P (View-Only) (Signed)
ELECTROPHYSIOLOGY CONSULT NOTE  Patient ID: Jeffrey Hancock, MRN: [id number], DOB/AGE: [birth year] 58 y.o. Admit date: (Not on file) Date of Consult: 02/07/2014  Primary Physician: Hilbert Corrigan, MD Primary Cardiologist: [facility]  Chief Complaint: atrial flutter   HPI Jeffrey Hancock is a 58 y.o. male  With a very complicated past medical history referred from the [place] for consideration of flutter ablation because of challenges in getting it done at time sensitive way.  He was initially diagnosed with atrial flutter in spring 2015. There are interval tracings and Holter monitor demonstrating sinus rhythm. He presents again today with atrial flutter but is clearly paroxysmal. He is unaware of it at this point although he thinks he is aware of it last night. ECGs and recorders had demonstrated nonsustained atrial tachycardia and frequent PACs and so there has been a question raised as to the value of flutter ablation given the relatively high likelihood of ensuing atrial fibrillation.  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 For this is been treated with antiplatelet therapy.  Upon identification of the atrial flutter the summer it was recommended that he begin on Coumadin. It was his desire that he go on a NOAC. However, this was not approved both because of concerns of prior gastric bypass as well as potentially the issue of atrial flutter not been approved indication for NOAC therapy. The notes from [place] are replete with confrontation related to medications, procedures and differences of opinion related to treatment options.  He is markedly limited in his ability to get around, with dyspnea at less than 100 feet. He has some peripheral edema. He has CPAP therapy for sleep apnea which is used for greater than 10 years.  As noted he underwent gastric bypass surgery and his weight is down 130  pounds from high 400s--mid 300s.  He continues with diabetes and hypertension.  Also, he apparently recently has been diagnosed with leukemia chemotherapy which has been delayed because of ongoing use of warfarin. These records are not available thru [facility].      Past Medical History  Diagnosis Date  . Hypertension   . Stroke   . Hyperlipidemia   . Diabetes mellitus without complication   . CVA (cerebral infarction)   . PVD (peripheral vascular disease)   . Atrial flutter, paroxysmal   . OSA (obstructive sleep apnea)   . Palpitations   . Arrhythmia     a-flutter  . Cancer   . Acute leukemia   . Coronary artery disease       Surgical History:  Past Surgical History  Procedure Laterality Date  . Gastric bypass    . Cardiac catheterization  07/2013     Home Meds: Prior to Admission medications   Medication Sig Start Date End Date Taking? Authorizing Provider  amLODipine (NORVASC) 10 MG tablet Take 10 mg by mouth daily.   Yes Historical Provider, MD  aspirin 81 MG tablet Take 81 mg by mouth daily.   Yes Historical Provider, MD  atenolol (TENORMIN) 50 MG tablet Take 50 mg by mouth daily.   Yes Historical Provider, MD  atorvastatin (LIPITOR) 10 MG tablet Take 10 mg by mouth daily.   Yes Historical Provider, MD  DULoxetine HCl 40 MG CPEP Take by mouth daily.   Yes Historical Provider, MD  furosemide (LASIX) 40 MG tablet Take 40 mg by mouth.   Yes Historical  Provider, MD  lisinopril (PRINIVIL,ZESTRIL) 40 MG tablet Take 40 mg by mouth daily.   Yes Historical Provider, MD  oxycodone (OXY-IR) 5 MG capsule Take 10 mg by mouth 2 (two) times daily.   Yes Historical Provider, MD  traZODone (DESYREL) 100 MG tablet Take 100 mg by mouth at bedtime.   Yes Historical Provider, MD  warfarin (COUMADIN) 5 MG tablet Take as directed   Yes Historical Provider, MD      Allergies:  Allergies  Allergen Reactions  . Codeine   . Isordil [Isosorbide]     Hives   . Nifedipine      History   Social History  . Marital Status: Unknown    Spouse Name: N/A    Number of Children: N/A  . Years of Education: N/A   Occupational History  . Not on file.   Social History Main Topics  . Smoking status: Former Smoker -- 1.00 packs/day for 10 years    Types: Cigarettes    Quit date: 02/07/2001  . Smokeless tobacco: Not on file  . Alcohol Use: No  . Drug Use: No  . Sexual Activity: Not on file   Other Topics Concern  . Not on file   Social History Narrative  . No narrative on file     Family History  Problem Relation Age of Onset  . Family history unknown: Yes     ROS:  Please see the history of present illness.     All other systems reviewed and negative.    Physical Exam:   Blood pressure 140/82, pulse 77, height 6' (1.829 m), weight 345 lb 4 oz (156.604 kg). General: Well developed, wmorbidly obese male in no acute distress. Head: Normocephalic, atraumatic, sclera non-icteric, no xanthomas, nares are without discharge. EENT: normal Lymph Nodes:  none Back: without scoliosis/kyphosis, no CVA tendersness Neck: Negative for carotid bruits.JVP 8-10 Lungs: Clear bilaterally to auscultation without wheezes, rales, or rhonchi. Breathing is unlabored. Heart: Irregularly irregular rate and rhythm with a 2/6 murmur , rubs, or gallops appreciated. Abdomen: Soft, mild right upper quadrant tenderness, non-distended with normoactive bowel sounds. No hepatomegaly. No rebound/guarding. No obvious abdominal masses. Msk:  Strength and tone appear normal for age. Extremities: No clubbing or cyanosis.  2+ edema.  Distal pedal pulses are 2+ and equal bilaterally. Skin: Warm and Dry Neuro: Alert and oriented X 3. CN III-XII intact Grossly normal sensory and motor function . Psych:  Responds to questions appropriately with a normal affect.        Radiology/Studies:  No results found.  EKG:  Atrial flutter with variable block   Assessment and Plan:  Atrial  flutter persisted with variable block  HFpEF  Cerebellar stroke  Diabetes/hypertension  History of gastric bypass  Leukemia (?)  The patient has a complex history and has atrial flutter that is intermittent, Holter monitoring has demonstrated runs of nonsustained atrial tachycardia and ECGs with frequent PACs both of which are likely harm to her atrial fibrillation,, a CHADS-VASc score of greater than or equal to 5 including a prior stroke and symptoms for which to his atrial flutter. Treatment of his leukemia however, is on hold because of his anticoagulation. These records are not available in the one that were reviewed from [facility].  It is likely that he will have recurrence of atrial fibrillation. It is also true, then my estimation, with his prior stroke he would be a long-term candidate for anticoagulation and not withstanding successful ablation of his flutter substrate.  There has apparently been some disagreement about this issue at [facility] according to the records.   His anticoagulation has also been very difficult to manage; his most recent INR is greater than 5. With his history of gastric bypass, he is not a candidate dabigitran. I wonder whether however, he would be a candidate for a Xa blocker. I will check into this.    Virl Axe

## 2014-02-16 NOTE — Progress Notes (Addendum)
At 1610  Pt c/o chest pressure and sharp sternal pain rate 6/10.  Pt states same as after ablation, in which pt had an EKG and 2Decho done while in EP lab no abnormalities.  Pt given sched dose of oxycodone 10mg  po at that time.  Pt went to sleep.   Now at 1755 pt awake and c/o pain same, rate 8/10. No prn med available to give,  Message left with [place], [place]. Await return call.

## 2014-02-16 NOTE — Anesthesia Preprocedure Evaluation (Addendum)
Anesthesia Evaluation  Patient identified by MRN, date of birth, ID band Patient awake    Reviewed: Allergy & Precautions, H&P , NPO status , Patient's Chart, lab work & pertinent test results, reviewed documented beta blocker date and time   History of Anesthesia Complications Negative for: history of anesthetic complications  Airway Mallampati: III  TM Distance: >3 FB Neck ROM: Full    Dental  (+) Teeth Intact, Dental Advisory Given   Pulmonary sleep apnea and Continuous Positive Airway Pressure Ventilation , former smoker,    Pulmonary exam normal       Cardiovascular hypertension, + CAD and + Peripheral Vascular Disease + dysrhythmias Atrial Fibrillation Rhythm:Irregular Rate:Normal     Neuro/Psych Left sided weakness CVA, Residual Symptoms negative psych ROS   GI/Hepatic negative GI ROS, Neg liver ROS,   Endo/Other  diabetes  Renal/GU negative Renal ROS     Musculoskeletal   Abdominal   Peds  Hematology   Anesthesia Other Findings   Reproductive/Obstetrics                          Anesthesia Physical Anesthesia Plan  ASA: III  Anesthesia Plan: General   Post-op Pain Management:    Induction: Intravenous  Airway Management Planned: Oral ETT and Video Laryngoscope Planned  Additional Equipment: Arterial line  Intra-op Plan:   Post-operative Plan: Extubation in OR  Informed Consent: I have reviewed the patients History and Physical, chart, labs and discussed the procedure including the risks, benefits and alternatives for the proposed anesthesia with the patient or authorized representative who has indicated his/her understanding and acceptance.   Dental advisory given  Plan Discussed with: CRNA, Anesthesiologist and Surgeon  Anesthesia Plan Comments:       Anesthesia Quick Evaluation

## 2014-02-16 NOTE — Progress Notes (Signed)
Nausea gone.

## 2014-02-16 NOTE — Progress Notes (Signed)
Site area: lt groin Site Prior to Removal:  Level 0 Pressure Applied For: 10 minutes. Sheaths removed by Nelda Severe Manual:   yes Patient Status During Pull:  stable Post Pull Site:  Level 0 Post Pull Instructions Given:  yes Post Pull Pulses Present: lt dp 2+ Dressing Applied:  tegaderm Bedrest begins @ 2778 Comments: no complications

## 2014-02-16 NOTE — Care Management Note (Addendum)
  Page 2 of 2   02/17/2014     11:59:59 AM CARE MANAGEMENT NOTE 02/17/2014  Patient:  Jeffrey Hancock, Jeffrey Hancock   Account Number:  [id number]  Date Initiated:  02/16/2014  Documentation initiated by:  Slater Mcmanaman  Subjective/Objective Assessment:   atrial flutter     Action/Plan:   CM to follow for disposition needs   Anticipated DC Date:  02/17/2014   Anticipated DC Plan:  [place]  CM consult  [place] referrals / transfers      [place]   Choice offered to / List presented to:  NA           Status of service:  Completed, signed off Medicare Important Message given?   (If response is "NO", the following Medicare IM given date fields will be blank) Date Medicare IM given:   Medicare IM given by:   Date Additional Medicare IM given:   Additional Medicare IM given by:    Discharge Disposition:  [place]  Per UR Regulation:    If discussed at Long Length of Stay Meetings, dates discussed:    Comments:  Dierra Riesgo RN, BSN, MSHL, CCM  Nurse - Case Manager,  (Unit [phone]  02/17/2014 Procedure: EPS entrainment mapping RF ablation on 02/16/2014 Specialty Med Review:  rivaroxaban (XARELTO) tablet 20 mg qd Social:  From home alone. Ambulates with walker prn and using Power Chair. Transportation:  [employer] provided via [employer] (patient states has to be prearranged through [place]) Medications:  [place] PCP:  [facility] Dr. Niger Reid Cardiologist:  Dr. Deboraha Sprang (approved by [facility] as outside provider of care) CM notified [facility] CM / contact Mickel Baas of services.  Mickel Baas instructs to contact [place] [phone] Toma Copier or Norina Buzzard [phone].  CM left VMM with Margarita Grizzle requesting call back re:  d/c planning needs. Call back from [place] H[facility]:  None PCS:  yes 2 hours / day x 5 days / week - [facility] - but seeking new provider as of 02/13/2014 d/t patient  notified [facility] of request for different provider d/t unhappy with services [place]  Phone # [phone] Fax # [phone] Contact:  Pam  Transportation Services [facility]:  ext 237  requires 1 week notice per VMM CM requested call back re:  d/c planning needs for transportation via [place]:  Home / Resume HHS with [facility]

## 2014-02-16 NOTE — Progress Notes (Signed)
Bedside 2-D echocardiogram done.

## 2014-02-16 NOTE — Op Note (Signed)
NAME:  Jeffrey Hancock, Jeffrey Hancock                  ACCOUNT NO.:  [id number]  MEDICAL RECORD NO.:  [id number]  LOCATION:  [facility]                         FACILITY:  [place]  PHYSICIAN:  Deboraha Sprang, MD, FACCDATE OF BIRTH:  28-Jul-1955  DATE OF PROCEDURE:  02/16/2014 DATE OF DISCHARGE:                              OPERATIVE REPORT   PREOPERATIVE DIAGNOSIS:  Atrial flutter.  POSTOPERATIVE DIAGNOSIS:  Atrial flutter.  PROCEDURE:  Invasive electrophysiological study with catheter ablation.  DESCRIPTION OF PROCEDURE:  On obtaining informed consent, the patient was brought to electrophysiology laboratory and placed on the fluoroscopic table in supine position.  He was sedated.  He was admitted for general anesthesia under the care of Dr. Tobias Alexander.  Cardiac catheterization was performed with local and general anesthesia. Following the procedure, the catheters were removed.  The sheaths were left in place and the patient was transferred to the holding area following extubation in stable condition.  Catheters of 5-French quadripolar catheter was inserted via left femoral vein to the AV junction.  A 6-French octapolar catheter was inserted in the right femoral vein to the os of the coronary sinus.  A 7-French dual decapolar catheter was inserted in the left femoral vein to the tricuspid annulus.  An 8-French 10-mm deflectable tip catheter was inserted via an SAFL sheath in the right femoral vein to mapping sites and posterior septal space.  Surface leads, 1 aVF and V1 were monitored continuously throughout the procedure.  Following insertion of the catheters, a stimulation protocol included: 1. Incremental atrial pacing. 2. Incremental ventricular pacing. 3. Entrainment mapping.  END-TIDAL RESULTS:  End-tidal surface electrocardiogram: 1. Rhythm:  Initial:  Atrial flutter; AA interval 250 milliseconds; RR     interval 562 milliseconds; and QRS duration 132 milliseconds.  HV     interval was not  able to be measured. 2. Final:  Rhythm:  Sinus; RR interval 746 milliseconds; PR interval     was 150 milliseconds.  P-wave duration was 115 milliseconds.  There     was some intermittent junctional rhythm.  QRS duration was 126     milliseconds.  QT interval 400 milliseconds.  END-TIDAL AV NODAL FUNCTION: 1. AV Wenckebach was 350 milliseconds. 2. VA conduction was Wenckebach at 600 milliseconds.  DUAL AV NODAL PHYSIOLOGY:  None was identified.  ACCESSORY PATHWAY:  No evidence of an accessory pathway was identified.  Ventricular response programmed stimulation was normal for ventricular stimulation as described.  The patient presented to the lab in atrial flutter.  There seemed to be some lower loop reentry at the same time.  Entrainment mapping confirmed cavotricuspid isthmus dependence.  Catheter ablation across the cavotricuspid isthmus successfully terminated the flutter, and then resulted in bidirectional isthmus conduction block with an A1, A2 interval of about 150 milliseconds.  Radiofrequency energy, total of 20 minutes and 50 seconds of RF was delivered across the cavotricuspid isthmus.  Fluoroscopy time, a total of 20.5 minutes of fluoroscopy time was utilized.  IMPRESSION: 1. Normal sinus rhythm. 2. Abnormal atrial function manifested by sustained atrial flutter,     successfully ablated. 3. Normal AV nodal function. 4. Normal His-Purkinje system function. 5. No accessory pathway. 6.  Normal ventricular response programmed stimulation.  SUMMARY AND CONCLUSION:  The results of electrophysiological testing confirmed cavotricuspid isthmus conduction, as responsible for the patient's atrial flutter.  Catheter ablation across the cavotricuspid isthmus was successful in eliminating conduction and bidirectional isthmus conduction block.  The patient will be observed overnight and anticipate discharge in the morning.     Deboraha Sprang, MD, [facility]     SCK/MEDQ   D:  02/16/2014  T:  02/16/2014  Job:  [id number]

## 2014-02-16 NOTE — Anesthesia Procedure Notes (Signed)
Procedure Name: Intubation Date/Time: 02/16/2014 8:08 AM Performed by: Julian Reil Pre-anesthesia Checklist: Patient identified, Emergency Drugs available, Suction available and Patient being monitored Patient Re-evaluated:Patient Re-evaluated prior to inductionOxygen Delivery Method: Circle system utilized Preoxygenation: Pre-oxygenation with 100% oxygen Intubation Type: IV induction Ventilation: Mask ventilation with difficulty and Two handed mask ventilation required Grade View: Grade I Tube type: Oral Tube size: 7.5 mm Number of attempts: 1 Airway Equipment and Method: Stylet and Video-laryngoscopy Placement Confirmation: ETT inserted through vocal cords under direct vision,  positive ETCO2 and breath sounds checked- equal and bilateral Secured at: 22 cm Tube secured with: Tape Dental Injury: Teeth and Oropharynx as per pre-operative assessment  Comments: Anticipated difficult airway. Able to mask ventilate, difficult d/t beard. Grade I view with Glidescope. Atraumatic intubation.

## 2014-02-16 NOTE — Anesthesia Postprocedure Evaluation (Signed)
Anesthesia Post Note  Patient: Jeffrey Hancock  Procedure(s) Performed: Procedure(s) (LRB): ATRIAL FLUTTER ABLATION (N/A)  Anesthesia type: general  Patient location: PACU  Post pain: Pain level controlled  Post assessment: Patient's Cardiovascular Status Stable  Last Vitals:  Filed Vitals:   02/16/14 1010  BP: 141/89  Pulse: 54  Temp:   Resp: 18    Post vital signs: Reviewed and stable  Level of consciousness: sedated  Complications: No apparent anesthesia complications

## 2014-02-16 NOTE — Progress Notes (Signed)
Rt radial arterial catheter removed and manual pressure held X 5 minutes. Small tegaderm dressing applied. Rt radial pulse 3+. Level O

## 2014-02-16 NOTE — Progress Notes (Signed)
Site area: rt groin Site Prior to Removal:  Level 0. Razor abrasions rt groin. Pressure Applied For: 15 minutes Manual:   yes Patient Status During Pull:  stable Post Pull Site:  Level 0 Post Pull Instructions Given:  yes Post Pull Pulses Present: rt dp 1+ Dressing Applied:  tegaderm Bedrest begins @ 2952 Comments: no complications

## 2014-02-16 NOTE — Transfer of Care (Signed)
Immediate Anesthesia Transfer of Care Note  Patient: Jeffrey Hancock  Procedure(s) Performed: Procedure(s): ATRIAL FLUTTER ABLATION (N/A)  Patient Location: PACU  Anesthesia Type:General  Level of Consciousness: awake, alert , oriented and patient cooperative  Airway & Oxygen Therapy: Patient Spontanous Breathing and Patient connected to nasal cannula oxygen  Post-op Assessment: Report given to PACU RN and Post -op Vital signs reviewed and stable  Post vital signs: Reviewed and stable  Complications: No apparent anesthesia complications

## 2014-02-16 NOTE — Progress Notes (Signed)
  Echocardiogram 2D Echocardiogram has been performed.  [place], [place] 02/16/2014, 4:17 PM

## 2014-02-16 NOTE — Interval H&P Note (Signed)
History and Physical Interval Note:  02/16/2014 7:25 AM  Jeffrey Hancock  has presented today for surgery, with the diagnosis of atrial flutter  The various methods of treatment have been discussed with the patient and family. After consideration of risks, benefits and other options for treatment, the patient has consented to  Procedure(s): ATRIAL FLUTTER ABLATION (N/A) as a surgical intervention .  The patient's history has been reviewed, patient examined, no change in status, stable for surgery.  I have reviewed the patient's chart and labs.  Questions were answered to the patient's satisfaction.     Jeffrey Hancock  hosptialized at [facility] for sob;; echo 45% r/o  Otherwise stable

## 2014-02-16 NOTE — CV Procedure (Signed)
Jamez Ambrocio [id number]  [id number]  Preop SP:ZZCKIC flutter Postop Dx same/   Procedure:EPS entrainment mapping RF ablation  Cx: None   EBL: Minimal    Dictation number [id number]  Virl Axe, MD 02/16/2014 9:42 AM

## 2014-02-17 ENCOUNTER — Encounter (HOSPITAL_COMMUNITY): Payer: Self-pay | Admitting: Physician Assistant

## 2014-02-17 ENCOUNTER — Telehealth: Payer: Self-pay | Admitting: Internal Medicine

## 2014-02-17 ENCOUNTER — Other Ambulatory Visit: Payer: Self-pay

## 2014-02-17 DIAGNOSIS — G4733 Obstructive sleep apnea (adult) (pediatric): Secondary | ICD-10-CM | POA: Diagnosis present

## 2014-02-17 DIAGNOSIS — I10 Essential (primary) hypertension: Secondary | ICD-10-CM | POA: Diagnosis not present

## 2014-02-17 DIAGNOSIS — I2510 Atherosclerotic heart disease of native coronary artery without angina pectoris: Secondary | ICD-10-CM | POA: Diagnosis not present

## 2014-02-17 DIAGNOSIS — I4892 Unspecified atrial flutter: Secondary | ICD-10-CM

## 2014-02-17 DIAGNOSIS — I639 Cerebral infarction, unspecified: Secondary | ICD-10-CM | POA: Diagnosis present

## 2014-02-17 DIAGNOSIS — C9500 Acute leukemia of unspecified cell type not having achieved remission: Secondary | ICD-10-CM | POA: Diagnosis present

## 2014-02-17 DIAGNOSIS — K219 Gastro-esophageal reflux disease without esophagitis: Secondary | ICD-10-CM | POA: Diagnosis present

## 2014-02-17 DIAGNOSIS — E785 Hyperlipidemia, unspecified: Secondary | ICD-10-CM | POA: Diagnosis not present

## 2014-02-17 DIAGNOSIS — E669 Obesity, unspecified: Secondary | ICD-10-CM | POA: Diagnosis present

## 2014-02-17 DIAGNOSIS — E119 Type 2 diabetes mellitus without complications: Secondary | ICD-10-CM

## 2014-02-17 LAB — GLUCOSE, CAPILLARY
GLUCOSE-CAPILLARY: 165 mg/dL — AB (ref 70–99)
Glucose-Capillary: 158 mg/dL — ABNORMAL HIGH (ref 70–99)

## 2014-02-17 MED ORDER — HYDRALAZINE HCL 10 MG PO TABS: 25.0000 mg | ORAL_TABLET | Freq: Two times a day (BID) | ORAL | Status: DC

## 2014-02-17 MED ORDER — FLECAINIDE ACETATE 100 MG PO TABS: 100.0000 mg | ORAL_TABLET | Freq: Two times a day (BID) | ORAL | Status: DC

## 2014-02-17 MED ORDER — FLECAINIDE ACETATE 100 MG PO TABS
100.0000 mg | ORAL_TABLET | Freq: Two times a day (BID) | ORAL | Status: DC
  Administered 2014-02-17: 12:00:00 100 mg via ORAL
  Filled 2014-02-17 (×2): qty 1

## 2014-02-17 NOTE — Discharge Summary (Signed)
Discharge Summary   Patient ID: Jeffrey Hancock MRN: [id number], DOB/AGE: Jul 11, 1955 58 y.o. Admit date: 02/16/2014 D/C date:     02/17/2014  Primary Cardiologist: Dr. Caryl Comes   Principal Problem:   Atrial flutter Active Problems:   Essential hypertension   GERD (gastroesophageal reflux disease)   CVA (cerebral infarction)   Acute leukemia   Coronary artery disease   Hypertension   Hyperlipidemia   Diabetes mellitus without complication   Obesity   OSA (obstructive sleep apnea)    Admission Dates: 02/16/14-02/17/14 Discharge Diagnosis: atrial flutter s/p unsuccessful RF ablation. Started on Flecainide and planned for DCCV next week  HPI: Jeffrey Hancock is a 58 y.o. male with a history of recently diagnosed leukemia, DM, HTN, CVA, HLD, PVD, OSA, obesity, CAD, GERD and PAF on Xarelto who presented to [facility] on 02/16/14 for planned atrial flutter RF ablation.    Hospital Course  Atrial flutter s/p RF ablation on 02/16/14 which was initially successful. However, he was noted to go back into a slow atrial flutter on 02/17/14 around 11am, which was confirmed by ECG. -- Post procedure echo was without effusion -- Will start Flecainide 100mg  BID and plan for outpatient DCCV in [place] next week.  -- Discharge on Xarelto ( he has 10 pills of samples ) will need 4 week supply. Can pick up the rest in the office at his DCCV. Will provide him a printed Rx as well.  -- I spoke Elmyra Ricks at the office who will call the patient to schedule to DCCV.   HTN- started on hydralazine 25 bid. -- Continue coreg 12.5mg  BID, Norvasc 10mg  qd, lisinopril 40mg  qd  HLD- continue statin   The patient has had an uncomplicated hospital course and is recovering well. The femoral catheter site is stable. He has been seen by Dr. Caryl Comes today and deemed ready for discharge home. All follow-up appointments have been scheduled. Discharge medications are listed below.   Discharge Vitals: Blood pressure 138/66, pulse 69,  temperature 99.4 F (37.4 C), temperature source Oral, resp. rate 20, height 6' (1.829 m), weight 341 lb 7.9 oz (154.9 kg), SpO2 93.00%.  Labs: Lab Results  Component Value Date   WBC 24.0* 02/16/2014   HGB 15.6 02/16/2014   HCT 46.0 02/16/2014   MCV 95.6 02/16/2014   PLT 182 02/16/2014     Recent Labs Lab 02/16/14 0620  NA 140  K 3.9  CL 99  CO2 28  BUN 11  CREATININE 0.97  CALCIUM 9.4  GLUCOSE 92    Diagnostic Studies/Procedures   NAME: TRAVEZ, STANCIL ACCOUNT NO.: [id number]  MEDICAL RECORD NO.: [id number]  LOCATION: [facility] FACILITY: [place]  PHYSICIAN: Jeffrey Sprang, MD, FACCDATE OF BIRTH: July 21, 1955  DATE OF PROCEDURE: 02/16/2014  DATE OF DISCHARGE:  OPERATIVE REPORT  PREOPERATIVE DIAGNOSIS: Atrial flutter.  POSTOPERATIVE DIAGNOSIS: Atrial flutter.  PROCEDURE: Invasive electrophysiological study with catheter ablation.  DESCRIPTION OF PROCEDURE: On obtaining informed consent, the patient  was brought to electrophysiology laboratory and placed on the  fluoroscopic table in supine position. He was sedated. He was admitted  for general anesthesia under the care of Dr. Tobias Alexander. Cardiac  catheterization was performed with local and general anesthesia.  Following the procedure, the catheters were removed. The sheaths were  left in place and the patient was transferred to the holding area  following extubation in stable condition.  Catheters of 5-French quadripolar catheter was inserted via left femoral  vein to the AV junction.  A 6-French octapolar catheter was  inserted in the right femoral vein to  the os of the coronary sinus.  A 7-French dual decapolar catheter was inserted in the left femoral vein  to the tricuspid annulus.  An 8-French 10-mm deflectable tip catheter was inserted via an SAFL  sheath in the right femoral vein to mapping sites and posterior septal  space.  Surface leads, 1 aVF and V1 were monitored continuously throughout the  procedure. Following  insertion of the catheters, a stimulation protocol  included:  1. Incremental atrial pacing.  2. Incremental ventricular pacing.  3. Entrainment mapping.  END-TIDAL RESULTS: End-tidal surface electrocardiogram:  1. Rhythm: Initial: Atrial flutter; AA interval 250 milliseconds; RR  interval 562 milliseconds; and QRS duration 132 milliseconds. HV  interval was not able to be measured.  2. Final: Rhythm: Sinus; RR interval 746 milliseconds; PR interval  was 150 milliseconds. P-wave duration was 115 milliseconds. There  was some intermittent junctional rhythm. QRS duration was 126  milliseconds. QT interval 400 milliseconds.  END-TIDAL AV NODAL FUNCTION:  1. AV Wenckebach was 350 milliseconds.  2. VA conduction was Wenckebach at 600 milliseconds.  DUAL AV NODAL PHYSIOLOGY: None was identified.  ACCESSORY PATHWAY: No evidence of an accessory pathway was identified.  Ventricular response programmed stimulation was normal for ventricular  stimulation as described.  The patient presented to the lab in atrial flutter. There seemed to be  some lower loop reentry at the same time. Entrainment mapping confirmed  cavotricuspid isthmus dependence. Catheter ablation across the  cavotricuspid isthmus successfully terminated the flutter, and then  resulted in bidirectional isthmus conduction block with an A1, A2  interval of about 150 milliseconds.  Radiofrequency energy, total of 20 minutes and 50 seconds of RF was  delivered across the cavotricuspid isthmus.  Fluoroscopy time, a total of 20.5 minutes of fluoroscopy time was  utilized.  IMPRESSION:  1. Normal sinus rhythm.  2. Abnormal atrial function manifested by sustained atrial flutter,  successfully ablated.  3. Normal AV nodal function.  4. Normal His-Purkinje system function.  5. No accessory pathway.  6. Normal ventricular response programmed stimulation.  SUMMARY AND CONCLUSION: The results of electrophysiological testing  confirmed  cavotricuspid isthmus conduction, as responsible for the  patient's atrial flutter. Catheter ablation across the cavotricuspid  isthmus was successful in eliminating conduction and bidirectional  isthmus conduction block.       2D ECHO: 02/16/2014 LV EF: 55% Study Conclusions - Left ventricle: The cavity size was normal. Wall thickness was increased in a pattern of moderate LVH. The estimated ejection fraction was 55%. Images were inadequate for LV wall motion assessment. Features are consistent with a pseudonormal left ventricular filling pattern, with concomitant abnormal relaxation and increased filling pressure (grade 2 diastolic dysfunction). - Aortic valve: There was no stenosis. - Mitral valve: Mildly calcified annulus. There was no significant regurgitation. - Left atrium: The atrium was mildly dilated. - Right ventricle: The cavity size was normal. Systolic function was normal. - Pulmonary arteries: No complete TR doppler jet so unable to estimate PA systolic pressure. - Systemic veins: IVC measured 2.4 cm with > 50% respirophasic variation, suggesting RA pressure 8 mmHg. - Pericardium, extracardiac: There was no pericardial effusion. Impressions: - Technically difficult study with poor acoustic windows. Normal LV size with moderate LV hypertrophy. EF 55%. Moderate diastolic dysfunction. Normal RV size and systolic function. No pericardial effusion.      Discharge Medications     Medication List    STOP taking these medications  atenolol 50 MG tablet  Commonly known as:  TENORMIN      TAKE these medications       amLODipine 10 MG tablet  Commonly known as:  NORVASC  Take 10 mg by mouth every morning.     atorvastatin 10 MG tablet  Commonly known as:  LIPITOR  Take 10 mg by mouth at bedtime.     CALCIUM PO  Take 1 tablet by mouth 2 (two) times daily.     carvedilol 12.5 MG tablet  Commonly known as:  COREG  Take 12.5 mg by mouth 2 (two)  times daily with a meal.     docusate sodium 100 MG capsule  Commonly known as:  COLACE  Take 400 mg by mouth at bedtime.     DULoxetine 20 MG capsule  Commonly known as:  CYMBALTA  Take 40 mg by mouth daily.     flecainide 100 MG tablet  Commonly known as:  TAMBOCOR  Take 1 tablet (100 mg total) by mouth 2 (two) times daily.     furosemide 80 MG tablet  Commonly known as:  LASIX  Take 1 tablet (80 mg total) by mouth daily.     hydrALAZINE 10 MG tablet  Commonly known as:  APRESOLINE  Take 2.5 tablets (25 mg total) by mouth 2 (two) times daily.     insulin aspart protamine- aspart (70-30) 100 UNIT/ML injection  Commonly known as:  NOVOLOG MIX 70/30  Inject 35 Units into the skin 2 (two) times daily. SLIDING SCALE     IRON PO  Take 1 tablet by mouth 4 (four) times a week.     lisinopril 40 MG tablet  Commonly known as:  PRINIVIL,ZESTRIL  Take 40 mg by mouth daily.     oxyCODONE 5 MG immediate release tablet  Commonly known as:  Oxy IR/ROXICODONE  Take 10 mg by mouth 2 (two) times daily.     potassium chloride SA 20 MEQ tablet  Commonly known as:  K-DUR,KLOR-CON  Take 40 mEq by mouth every morning.     rivaroxaban 20 MG Tabs tablet  Commonly known as:  XARELTO  Take 1 tablet (20 mg total) by mouth daily with supper.     traZODone 100 MG tablet  Commonly known as:  DESYREL  Take 100 mg by mouth at bedtime.     VITAMIN B-12 PO  Take 1 tablet by mouth 4 (four) times a week.        Disposition   The patient will be discharged in stable condition to home.  Follow-up Information   Follow up with Virl Axe, MD On 03/07/2014. (@ 1:15pm)    Specialty:  Cardiology   Contact information:   [place] [id number] [phone]         Duration of Discharge Encounter: Greater than 30 minutes including physician and PA time.  Mable Fill R PA-C 02/17/2014, 12:27 PM

## 2014-02-17 NOTE — Telephone Encounter (Signed)
Called patient informed him that I will call him Monday with cardioversion date

## 2014-02-17 NOTE — Progress Notes (Signed)
       Patient Name: Jeffrey Hancock      SUBJECTIVE:* recurrent pain slept through repeatedly  Post procedure echo was without effusion and ECG without acute changes  Past Medical History  Diagnosis Date  . Hypertension   . Stroke   . Hyperlipidemia   . Diabetes mellitus without complication   . CVA (cerebral infarction)   . PVD (peripheral vascular disease)   . Atrial flutter, paroxysmal   . OSA (obstructive sleep apnea)   . Palpitations   . Arrhythmia     a-flutter  . Cancer   . Acute leukemia   . Coronary artery disease   . GERD (gastroesophageal reflux disease)   . H/O hiatal hernia     Scheduled Meds:  Scheduled Meds: . amLODipine  10 mg Oral q morning - 10a  . atorvastatin  10 mg Oral QHS  . carvedilol  12.5 mg Oral BID WC  . docusate sodium  400 mg Oral QHS  . DULoxetine  40 mg Oral Daily  . furosemide  80 mg Oral Daily  . insulin aspart protamine- aspart  35 Units Subcutaneous BID WC  . lisinopril  40 mg Oral Daily  . oxyCODONE  10 mg Oral BID  . potassium chloride SA  40 mEq Oral q morning - 10a  . rivaroxaban  20 mg Oral Q supper  . sodium chloride  3 mL Intravenous Q12H  . traZODone  100 mg Oral QHS   Continuous Infusions:  sodium chloride, acetaminophen, neomycin-bacitracin-polymyxin, ondansetron (ZOFRAN) IV, sodium chloride    PHYSICAL EXAM Filed Vitals:   02/16/14 1900 02/16/14 2200 02/17/14 0023 02/17/14 0445  BP: 180/77  161/85 147/61  Pulse: 63 57 60 68  Temp: 98.7 F (37.1 C)  98.5 F (36.9 C) 99.4 F (37.4 C)  TempSrc: Oral  Oral Oral  Resp: 20  20 20   Height:      Weight:   341 lb 7.9 oz (154.9 kg)   SpO2: 93% 95% 97% 94%    Well developed and nourished in no acute distress HENT normal Neck supple with JVP-flat Clear Regular rate and rhythm, no murmurs or gallops Abd-soft with active BS Groins ok No Clubbing cyanosis edema Skin-warm and dry A & Oriented  Grossly normal sensory and motor function   TELEMETRY: Reviewed  telemetry pt in NSR:    Intake/Output Summary (Last 24 hours) at 02/17/14 0643 Last data filed at 02/16/14 2200  Gross per 24 hour  Intake    420 ml  Output    150 ml  Net    270 ml    LABS: Basic Metabolic Panel:  Recent Labs Lab 02/16/14 0620  NA 140  K 3.9  CL 99  CO2 28  GLUCOSE 92  BUN 11  CREATININE 0.97  CALCIUM 9.4   Cardiac Enzymes: No results found for this basename: CKTOTAL, CKMB, CKMBINDEX, TROPONINI,  in the last 72 hours CBC:  Recent Labs Lab 02/16/14 0620  WBC 24.0*  HGB 15.6  HCT 46.0  MCV 95.6  PLT 182   PROTIME:  Recent Labs  02/16/14 0620  LABPROT 23.0*  INR 2.01*      ASSESSMENT AND PLAN:  Active Problems:   Atrial flutter   Essential hypertension discharge today on Rivaroxaban will need 4 weeks of samples ( may some already) F/u SK [place] 6 weeks  O/w with [facility] Begin hydralazine 25 bid for bp  Signed, Virl Axe MD  02/17/2014

## 2014-02-17 NOTE — Telephone Encounter (Signed)
Pt has an apt for a fu from the cardioversion, but is not sure why does he have this and he has not had that done. Please call pt. He was a bit upset. He had his cath yesterday. And pt pt can't get refills bc of this. Please help him. He is confused and upset now.

## 2014-02-17 NOTE — Progress Notes (Signed)
Per cardiac monitoring pt's rhythm noted aflutter,rate 53, denies discomforts, resting on bed. Nell Range PA notified and will notify Dr Caryl Comes .  Stat EKG done.

## 2014-02-18 ENCOUNTER — Other Ambulatory Visit: Payer: Self-pay

## 2014-02-18 NOTE — Discharge Summary (Signed)
Pt ith recuurrent aflutter but atypical doees not appear reverse typical but could be.  Will initate antiarrhtymic therapy and anticpate cardioversion

## 2014-02-20 ENCOUNTER — Telehealth: Payer: Self-pay | Admitting: *Deleted

## 2014-02-20 ENCOUNTER — Encounter: Payer: Self-pay | Admitting: *Deleted

## 2014-02-20 NOTE — Telephone Encounter (Signed)
error 

## 2014-02-20 NOTE — Telephone Encounter (Signed)
Your physician has recommended that you have a Cardioversion (DCCV). Electrical Cardioversion uses a jolt of electricity to your heart either through paddles or wired patches attached to your chest. This is a controlled, usually prescheduled, procedure. Defibrillation is done under light anesthesia in the hospital, and you usually go home the day of the procedure. This is done to get your heart back into a normal rhythm. You are not awake for the procedure. Please see the instruction sheet given to you today.   Scheduled cardioversion as ordered by Dr. Caryl Comes  Instructions reviewed with patient  Patient verbalized understanding   Instruction letter left at from desk for patient

## 2014-02-20 NOTE — Telephone Encounter (Signed)
Please call patient regarding his appt with Dr. Caryl Comes on 03/07/14. Does he need that appt?

## 2014-02-20 NOTE — Telephone Encounter (Signed)
lvm 11/02

## 2014-02-21 ENCOUNTER — Telehealth: Payer: Self-pay | Admitting: Internal Medicine

## 2014-02-21 ENCOUNTER — Telehealth: Payer: Self-pay

## 2014-02-21 NOTE — Telephone Encounter (Signed)
[facility] to [facility], warfin, only because they feel like the "riorocksadan" may be not well for him. and they offered to [facility]. Please call back as soon as you can. Pt is also upset he is getting mixed message.

## 2014-02-21 NOTE — Telephone Encounter (Signed)
Pt would like Xarelto samples.  

## 2014-02-21 NOTE — Telephone Encounter (Signed)
Spoke with Edythe Lynn D from the [place], who is inquiring if dr. Caryl Comes would consider changing patient's Xarelto to Coumadin. Her reasoning is that since patient had gastric bypass surgery,  The absorption rate of Xarelto is somewhere between 30-60%. They would be willing to monitor his INR's there.

## 2014-02-21 NOTE — Telephone Encounter (Signed)
Xarelto Samples @ front desk for pick up.

## 2014-02-22 ENCOUNTER — Telehealth: Payer: Self-pay | Admitting: *Deleted

## 2014-02-22 NOTE — Telephone Encounter (Signed)
Instructed patient to keep appt

## 2014-02-22 NOTE — Telephone Encounter (Signed)
Per Dr. Caryl Comes,  He would prefer that patient remain on Xarelto 20mg  daily, and not Coumadin, but says he knows the [place] probably won't authorize it. I spoke with patient in this regard, and told him we would provide samples for him as long as possible. He stated Jeffrey Hancock at [place] office gave him 3 weeks worth of samples. Will put them up front for patient pick-up. His cardioversion is scheduled for tomorrow.

## 2014-02-22 NOTE — Telephone Encounter (Signed)
Cardioversion orders faxed to cath lab  Santiago Glad conformed recpt

## 2014-02-23 ENCOUNTER — Ambulatory Visit: Payer: Self-pay | Admitting: Cardiovascular Disease

## 2014-02-23 LAB — COMPREHENSIVE METABOLIC PANEL
ALBUMIN: 2.9 g/dL — AB (ref 3.4–5.0)
ALK PHOS: 75 U/L
ALT: 21 U/L
ANION GAP: 5 — AB (ref 7–16)
BILIRUBIN TOTAL: 0.5 mg/dL (ref 0.2–1.0)
BUN: 12 mg/dL (ref 7–18)
CHLORIDE: 106 mmol/L (ref 98–107)
CREATININE: 1.16 mg/dL (ref 0.60–1.30)
Calcium, Total: 8.3 mg/dL — ABNORMAL LOW (ref 8.5–10.1)
Co2: 29 mmol/L (ref 21–32)
Glucose: 266 mg/dL — ABNORMAL HIGH (ref 65–99)
OSMOLALITY: 288 (ref 275–301)
POTASSIUM: 4 mmol/L (ref 3.5–5.1)
SGOT(AST): 21 U/L (ref 15–37)
Sodium: 140 mmol/L (ref 136–145)
Total Protein: 6.5 g/dL (ref 6.4–8.2)

## 2014-02-23 LAB — CBC
HCT: 39 % — AB (ref 40.0–52.0)
HGB: 12.8 g/dL — ABNORMAL LOW (ref 13.0–18.0)
MCH: 32.8 pg (ref 26.0–34.0)
MCHC: 33 g/dL (ref 32.0–36.0)
MCV: 100 fL (ref 80–100)
Platelet: 162 10*3/uL (ref 150–440)
RBC: 3.91 10*6/uL — ABNORMAL LOW (ref 4.40–5.90)
RDW: 15.1 % — AB (ref 11.5–14.5)
WBC: 25.6 10*3/uL — AB (ref 3.8–10.6)

## 2014-02-23 LAB — TROPONIN I: Troponin-I: 0.05 ng/mL

## 2014-02-23 LAB — PROTIME-INR
INR: 2
Prothrombin Time: 21.9 secs — ABNORMAL HIGH (ref 11.5–14.7)

## 2014-02-23 LAB — APTT: ACTIVATED PTT: 33.2 s (ref 23.6–35.9)

## 2014-02-23 NOTE — Telephone Encounter (Signed)
LVM 11/05

## 2014-02-24 ENCOUNTER — Observation Stay: Payer: Self-pay | Admitting: Internal Medicine

## 2014-02-24 DIAGNOSIS — I4892 Unspecified atrial flutter: Secondary | ICD-10-CM

## 2014-02-24 DIAGNOSIS — R5381 Other malaise: Secondary | ICD-10-CM

## 2014-02-24 DIAGNOSIS — I10 Essential (primary) hypertension: Secondary | ICD-10-CM

## 2014-02-24 LAB — TROPONIN I
Troponin-I: 0.05 ng/mL
Troponin-I: 0.05 ng/mL

## 2014-02-24 LAB — CK-MB: CK-MB: 2 ng/mL (ref 0.5–3.6)

## 2014-03-03 DIAGNOSIS — I4892 Unspecified atrial flutter: Secondary | ICD-10-CM | POA: Insufficient documentation

## 2014-03-03 DIAGNOSIS — I739 Peripheral vascular disease, unspecified: Secondary | ICD-10-CM | POA: Insufficient documentation

## 2014-03-03 DIAGNOSIS — Z8719 Personal history of other diseases of the digestive system: Secondary | ICD-10-CM | POA: Insufficient documentation

## 2014-03-07 ENCOUNTER — Encounter: Payer: Self-pay | Admitting: Internal Medicine

## 2014-03-07 ENCOUNTER — Ambulatory Visit (INDEPENDENT_AMBULATORY_CARE_PROVIDER_SITE_OTHER): Payer: Non-veteran care | Admitting: Internal Medicine

## 2014-03-07 VITALS — BP 132/94 | HR 55 | Ht 72.0 in | Wt 341.1 lb

## 2014-03-07 DIAGNOSIS — Z6841 Body Mass Index (BMI) 40.0 and over, adult: Secondary | ICD-10-CM

## 2014-03-07 DIAGNOSIS — I4892 Unspecified atrial flutter: Secondary | ICD-10-CM | POA: Diagnosis not present

## 2014-03-07 DIAGNOSIS — R0789 Other chest pain: Secondary | ICD-10-CM

## 2014-03-07 DIAGNOSIS — I509 Heart failure, unspecified: Secondary | ICD-10-CM | POA: Diagnosis not present

## 2014-03-07 DIAGNOSIS — I480 Paroxysmal atrial fibrillation: Secondary | ICD-10-CM

## 2014-03-07 DIAGNOSIS — I5030 Unspecified diastolic (congestive) heart failure: Secondary | ICD-10-CM

## 2014-03-07 MED ORDER — FLECAINIDE ACETATE 100 MG PO TABS: 100.0000 mg | ORAL_TABLET | Freq: Two times a day (BID) | ORAL | Status: AC

## 2014-03-07 MED ORDER — FUROSEMIDE 80 MG PO TABS: 80.0000 mg | ORAL_TABLET | Freq: Every day | ORAL | Status: DC

## 2014-03-07 NOTE — Telephone Encounter (Signed)
Dr. Caryl Comes reviewed with patient today in clinic

## 2014-03-07 NOTE — Progress Notes (Signed)
Patient Care Team: Niger Reid, MD as PCP - General (Internal Medicine) Niger Reid, MD (Internal Medicine)   HPI  Jeffrey Hancock is a 58 y.o. male Seen in follow-up for atrial flutter. This was initially diagnosed at [facility] in the [facility] spring 2015 and he underwent catheter ablation October 2585 that was complicated by atrial fibrillation.  She has a history of a prior stroke hypertension and diabetes.  He has a history of HFpEF with echocardiogram 10/15 demonstrated an EF of 55%  He was hospitalized at [facility] last week for chest pain and palpitations associated with exertion; he ruled out. No clear explanation was available.  He has had some recent increase in weight, i.e. 7 pounds over 3 days. His oral intake is about 86 ounces a day. He does respond briskly to his Lasix 80 mg.  He has a history of leukemia; he is to be seen at the [facility] Thursday  Past Medical History  Diagnosis Date  . Hypertension   . Hyperlipidemia   . Diabetes mellitus without complication   . CVA (cerebral infarction)   . PVD (peripheral vascular disease)   . Atrial flutter, paroxysmal     a. s/p failed RF ablation and placed on flecainide   . OSA (obstructive sleep apnea)   . Acute leukemia   . Coronary artery disease   . GERD (gastroesophageal reflux disease)   . H/O hiatal hernia   . Obesity     Past Surgical History  Procedure Laterality Date  . Gastric bypass    . Cardiac catheterization  07/2013  . Ablation of dysrhythmic focus  02/16/2014    ATRIAL FLUTTER       DR Caryl Comes   . Nasal sinus surgery    . Elbow surgery Right 1998    Current Outpatient Prescriptions  Medication Sig Dispense Refill  . amLODipine (NORVASC) 10 MG tablet Take 10 mg by mouth every morning.     Marland Kitchen atorvastatin (LIPITOR) 10 MG tablet Take 10 mg by mouth at bedtime.     Marland Kitchen CALCIUM PO Take 1 tablet by mouth 2 (two) times daily.    . carvedilol (COREG) 12.5 MG tablet Take 12.5 mg by mouth 2 (two) times daily with a meal.     . Cyanocobalamin (VITAMIN B-12 PO) Take 1 tablet by mouth 4 (four) times a week.    . docusate sodium (COLACE) 100 MG capsule Take 400 mg by mouth at bedtime.    . DULoxetine (CYMBALTA) 20 MG capsule Take 40 mg by mouth daily.    . flecainide (TAMBOCOR) 100 MG tablet Take 1 tablet (100 mg total) by mouth 2 (two) times daily. 30 tablet 1  . furosemide (LASIX) 80 MG tablet Take 1 tablet (80 mg total) by mouth daily. 90 tablet 3  . hydrALAZINE (APRESOLINE) 10 MG tablet Take 2.5 tablets (25 mg total) by mouth 2 (two) times daily. 60 tablet 11  . insulin aspart protamine- aspart (NOVOLOG MIX 70/30) (70-30) 100 UNIT/ML injection Inject 35 Units into the skin 2 (two) times daily. SLIDING SCALE    . IRON PO Take 1 tablet by mouth 4 (four) times a week.    Marland Kitchen lisinopril (PRINIVIL,ZESTRIL) 40 MG tablet Take 40 mg by mouth daily.    Marland Kitchen oxyCODONE (OXY IR/ROXICODONE) 5 MG immediate release tablet Take 10 mg by mouth 2 (two) times daily.    . potassium chloride SA (K-DUR,KLOR-CON) 20 MEQ tablet Take 40 mEq by mouth every morning.    Marland Kitchen  rivaroxaban (XARELTO) 20 MG TABS tablet Take 1 tablet (20 mg total) by mouth daily with supper. 30 tablet 6  . traZODone (DESYREL) 100 MG tablet Take 100 mg by mouth at bedtime.     No current facility-administered medications for this visit.    Allergies  Allergen Reactions  . Codeine Swelling  . Isordil [Isosorbide]     Hives   . Nifedipine Other (See Comments)    Headache     Review of Systems negative except from HPI and PMH  Physical Exam BP 132/94 mmHg  Pulse 55  Ht 6' (1.829 m)  Wt 341 lb 1.9 oz (154.731 kg)  BMI 46.25 kg/m2 Well developed and well nourished in no acute distress HENT normal E scleral and icterus clear Neck Supple JVP flat; carotids brisk and full Clear to ausculation  Slow Regular rate and rhythm, no murmurs gallops or rub Soft with active bowel sounds No clubbing cyanosis Trace Edema Alert and oriented, grossly normal motor and  sensory function Skin Warm and Dry  ECG demonstrates sinus rhythm at 55 Intervals 16/12/42  Assessment and  Plan  Atrial flutter status post ablation  Paroxysmal atrial fibrillation  Chest pain-atypical  Palpitations.  Morbid obesity status post gastric bypass  He continues to have some palpitations. We will continue him on flecainide 100 mg twice daily.  He'll need to stay on long-term anticoagulation with his prior stroke.  He is having some problems with fluid accumulation; rather than increase his medication I will have him work on decreasing his by mouth intake by 15-25%  We will refill his prescription for Lasix 80 which she will get at the [place]. He will need interval blood work  I offered him cardiac rehabilitation but he declined

## 2014-03-07 NOTE — Patient Instructions (Signed)
Your physician recommends that you schedule a follow-up appointment in: 3 months with Dr. Klein.  Your physician recommends that you continue on your current medications as directed. Please refer to the Current Medication list given to you today.   

## 2014-03-07 NOTE — Telephone Encounter (Signed)
Patient discussed in clinic today with Dr. Caryl Comes

## 2014-03-08 ENCOUNTER — Telehealth: Payer: Self-pay | Admitting: Internal Medicine

## 2014-03-08 NOTE — Telephone Encounter (Signed)
Pt would like to get some samples of xerltro.  Pt would like to pick them up in [place]. He needs them after thanksgiving. Please call pt if you have any questions

## 2014-03-10 NOTE — Telephone Encounter (Signed)
Patient explained that he has enough samples to get him through until after christmas. I also explained that we normally don't give samples for months at a time. Patient is aware. He explains that he went to Oncologist yesterday who is considering switching him back to Coumadin secondary to therapies being started for leukemia and medication interactions. Patient is going to obtain office notes so that I may have Dr. Caryl Comes review. He will drop notes by the office when he gets them.

## 2014-03-13 ENCOUNTER — Telehealth: Payer: Self-pay | Admitting: Internal Medicine

## 2014-03-13 ENCOUNTER — Telehealth: Payer: Self-pay

## 2014-03-13 NOTE — Telephone Encounter (Signed)
[facility] office

## 2014-03-13 NOTE — Telephone Encounter (Signed)
Pt calling asking about rx that needed to be faxed over.

## 2014-03-13 NOTE — Telephone Encounter (Signed)
LVM 11/23

## 2014-03-13 NOTE — Telephone Encounter (Signed)
Pharmacist called regarding a physician verification form that was sent to [facility] office on Thursday 11/19, states they still have not received it, and pt is there now to get rx filled.

## 2014-03-15 NOTE — Telephone Encounter (Signed)
Late entry: 03/14/14  Faxed completed and faxed to [facility] -- [phone] fax

## 2014-03-15 NOTE — Telephone Encounter (Signed)
Advised patient paperwork completed and faxed.

## 2014-03-20 NOTE — Telephone Encounter (Signed)
Please call patient as he has gained 6 lbs in the last 24 hours.

## 2014-03-20 NOTE — Telephone Encounter (Signed)
I spoke with the patient. He states that his weight yesterday was 344 lbs and today he is 351 lbs. He is a little more SOB and is having fluid retention around his belly. Per Dr. Olin Pia last office note on 11/17, the patient was taking in about 86 oz of fluid a day. He states he uses crystal light and divides this into water bottles. He states he has cut back about 2-3 bottles a day on his fluid. I have advised him to try to continue to cut back some on his fluid intake. I have also advised him to take an additional 40 mg of lasix x 3 days (I advised he may want to start this tomorrow as it is 4:30 pm now) and to call us on Thursday and let us know how his symptoms are doing. I have also advised that if symptoms worsen in the interim, he is to call and let us know. The patient is aware I will forward this to Dr. Caryl Comes to review. We will call him with any other recommendations prior to Thursday.

## 2014-03-23 ENCOUNTER — Telehealth: Payer: Self-pay | Admitting: *Deleted

## 2014-03-23 ENCOUNTER — Telehealth: Payer: Self-pay | Admitting: Internal Medicine

## 2014-03-23 NOTE — Telephone Encounter (Signed)
Patient states he has gained another 5 lbs since he last called  He is short of breath on exertion and ankles are swollen  He is taking his medication as ordered  He states he is following a low sodium diet   I informed patient that I would discuss his symptoms with Dr. Caryl Comes   Advised patient to contact EMS if his symptoms worsen or he feels his situation becomes emergent  Patient verbalized understanding

## 2014-03-23 NOTE — Telephone Encounter (Signed)
New message ° ° ° ° °Returning Sherri's call °

## 2014-03-23 NOTE — Telephone Encounter (Signed)
Dr. Caryl Comes called and  Spoke with patient. Instructed to take Lasix 120 mg once. Start Toresemide 40 mg daily -- handwritten rx left at front desk for pick up. Handwritten rx also given for [employer] monitoring. Patient verbalized understanding and agreeable to plan.

## 2014-03-23 NOTE — Telephone Encounter (Signed)
Patient has gained another 5 lbs of fluid. Please call patient.

## 2014-03-23 NOTE — Telephone Encounter (Signed)
Message sent to Dr. Caryl Comes 12/3

## 2014-03-27 ENCOUNTER — Telehealth: Payer: Self-pay | Admitting: Internal Medicine

## 2014-03-27 ENCOUNTER — Encounter: Payer: Medicare Other | Admitting: Internal Medicine

## 2014-03-27 NOTE — Telephone Encounter (Signed)
Informed patient [employer] rx written with diagnosis code on prescription. [facility] - [phone] Ext. [phone], fax [phone].  Also informed him that ok to switch to coumadin, per Dr. Caryl Comes. Patient verbalized understanding.

## 2014-03-27 NOTE — Telephone Encounter (Signed)
Walk In pt Form " RX Question about Monitor" gave to Sherri/KM

## 2014-03-28 ENCOUNTER — Telehealth: Payer: Self-pay | Admitting: Internal Medicine

## 2014-03-28 NOTE — Telephone Encounter (Signed)
Faxed [employer] rx order to [phone], per her request.

## 2014-03-28 NOTE — Telephone Encounter (Signed)
New Msg  Jacob Moores RN at [employer]. Please contact at [phone] ext [phone] or  [phone]. Kendrick Fries states that patient has requested a device to be attached to his smart phone and she wants to verify that this is in fact a dr's order.

## 2014-03-28 NOTE — Telephone Encounter (Signed)
[facility] that was sent to them but dr forgot to sign, Furosemide  Was the rx.  Please fax to, [facility].  [phone]

## 2014-03-28 NOTE — Telephone Encounter (Signed)
Torsemide 40 mg daily handwritten rx faxed to request number below.

## 2014-03-30 ENCOUNTER — Telehealth: Payer: Self-pay

## 2014-03-30 ENCOUNTER — Encounter (HOSPITAL_COMMUNITY): Payer: Self-pay | Admitting: Internal Medicine

## 2014-03-30 NOTE — Telephone Encounter (Signed)
[facility]ist with [facility] called, wanting to know if we have stopped giving samples of Xarelto. States pt has told her he was told from our office that he could not receive any more samples. Please call,.

## 2014-04-23 ENCOUNTER — Encounter (HOSPITAL_COMMUNITY): Payer: Self-pay | Admitting: Physical Medicine and Rehabilitation

## 2014-04-23 ENCOUNTER — Emergency Department (HOSPITAL_COMMUNITY): Payer: Medicare Other

## 2014-04-23 ENCOUNTER — Observation Stay (HOSPITAL_COMMUNITY)
Admission: EM | Admit: 2014-04-23 | Discharge: 2014-04-24 | Disposition: A | Discharge status: Home / Self Care | Payer: Medicare Other | Attending: Internal Medicine | Admitting: Internal Medicine

## 2014-04-23 DIAGNOSIS — G4733 Obstructive sleep apnea (adult) (pediatric): Secondary | ICD-10-CM | POA: Diagnosis present

## 2014-04-23 DIAGNOSIS — Z79899 Other long term (current) drug therapy: Secondary | ICD-10-CM | POA: Diagnosis not present

## 2014-04-23 DIAGNOSIS — Z6841 Body Mass Index (BMI) 40.0 and over, adult: Secondary | ICD-10-CM | POA: Diagnosis not present

## 2014-04-23 DIAGNOSIS — Z7901 Long term (current) use of anticoagulants: Secondary | ICD-10-CM | POA: Insufficient documentation

## 2014-04-23 DIAGNOSIS — I2510 Atherosclerotic heart disease of native coronary artery without angina pectoris: Secondary | ICD-10-CM | POA: Insufficient documentation

## 2014-04-23 DIAGNOSIS — Z87891 Personal history of nicotine dependence: Secondary | ICD-10-CM | POA: Diagnosis not present

## 2014-04-23 DIAGNOSIS — I739 Peripheral vascular disease, unspecified: Secondary | ICD-10-CM | POA: Insufficient documentation

## 2014-04-23 DIAGNOSIS — Z888 Allergy status to other drugs, medicaments and biological substances status: Secondary | ICD-10-CM | POA: Diagnosis not present

## 2014-04-23 DIAGNOSIS — I639 Cerebral infarction, unspecified: Secondary | ICD-10-CM | POA: Diagnosis not present

## 2014-04-23 DIAGNOSIS — Z885 Allergy status to narcotic agent status: Secondary | ICD-10-CM | POA: Insufficient documentation

## 2014-04-23 DIAGNOSIS — I5032 Chronic diastolic (congestive) heart failure: Secondary | ICD-10-CM | POA: Diagnosis not present

## 2014-04-23 DIAGNOSIS — M6289 Other specified disorders of muscle: Secondary | ICD-10-CM | POA: Diagnosis not present

## 2014-04-23 DIAGNOSIS — C9290 Myeloid leukemia, unspecified, not having achieved remission: Secondary | ICD-10-CM | POA: Diagnosis not present

## 2014-04-23 DIAGNOSIS — E119 Type 2 diabetes mellitus without complications: Secondary | ICD-10-CM | POA: Diagnosis not present

## 2014-04-23 DIAGNOSIS — I4892 Unspecified atrial flutter: Secondary | ICD-10-CM | POA: Diagnosis not present

## 2014-04-23 DIAGNOSIS — K219 Gastro-esophageal reflux disease without esophagitis: Secondary | ICD-10-CM | POA: Insufficient documentation

## 2014-04-23 DIAGNOSIS — R531 Weakness: Secondary | ICD-10-CM | POA: Diagnosis not present

## 2014-04-23 DIAGNOSIS — I10 Essential (primary) hypertension: Secondary | ICD-10-CM | POA: Insufficient documentation

## 2014-04-23 DIAGNOSIS — Z3A01 Less than 8 weeks gestation of pregnancy: Secondary | ICD-10-CM

## 2014-04-23 DIAGNOSIS — Z794 Long term (current) use of insulin: Secondary | ICD-10-CM | POA: Insufficient documentation

## 2014-04-23 DIAGNOSIS — E785 Hyperlipidemia, unspecified: Secondary | ICD-10-CM | POA: Diagnosis not present

## 2014-04-23 DIAGNOSIS — C9210 Chronic myeloid leukemia, BCR/ABL-positive, not having achieved remission: Secondary | ICD-10-CM | POA: Diagnosis present

## 2014-04-23 DIAGNOSIS — R270 Ataxia, unspecified: Secondary | ICD-10-CM | POA: Diagnosis not present

## 2014-04-23 DIAGNOSIS — Z9884 Bariatric surgery status: Secondary | ICD-10-CM | POA: Diagnosis not present

## 2014-04-23 DIAGNOSIS — G459 Transient cerebral ischemic attack, unspecified: Secondary | ICD-10-CM | POA: Diagnosis present

## 2014-04-23 LAB — COMPREHENSIVE METABOLIC PANEL
ALBUMIN: 3.4 g/dL — AB (ref 3.5–5.2)
ALK PHOS: 77 U/L (ref 39–117)
ALT: 22 U/L (ref 0–53)
ANION GAP: 5 (ref 5–15)
AST: 22 U/L (ref 0–37)
BUN: 13 mg/dL (ref 6–23)
CALCIUM: 8.5 mg/dL (ref 8.4–10.5)
CO2: 31 mmol/L (ref 19–32)
Chloride: 96 mEq/L (ref 96–112)
Creatinine, Ser: 1.27 mg/dL (ref 0.50–1.35)
GFR calc Af Amer: 71 mL/min — ABNORMAL LOW (ref >90)
GFR calc non Af Amer: 62 mL/min — ABNORMAL LOW (ref >90)
Glucose, Bld: 347 mg/dL — ABNORMAL HIGH (ref 70–99)
POTASSIUM: 4.2 mmol/L (ref 3.5–5.1)
Sodium: 132 mmol/L — ABNORMAL LOW (ref 135–145)
Total Bilirubin: 0.5 mg/dL (ref 0.3–1.2)
Total Protein: 6.5 g/dL (ref 6.0–8.3)

## 2014-04-23 LAB — CBC
HCT: 43.8 % (ref 39.0–52.0)
Hemoglobin: 14.5 g/dL (ref 13.0–17.0)
MCH: 31.9 pg (ref 26.0–34.0)
MCHC: 33.1 g/dL (ref 30.0–36.0)
MCV: 96.5 fL (ref 78.0–100.0)
Platelets: 163 10*3/uL (ref 150–400)
RBC: 4.54 MIL/uL (ref 4.22–5.81)
RDW: 14.2 % (ref 11.5–15.5)
WBC: 34.4 10*3/uL — ABNORMAL HIGH (ref 4.0–10.5)

## 2014-04-23 LAB — DIFFERENTIAL
BLASTS: 0 %
Band Neutrophils: 5 % (ref 0–10)
Basophils Absolute: 0.7 10*3/uL — ABNORMAL HIGH (ref 0.0–0.1)
Basophils Relative: 2 % — ABNORMAL HIGH (ref 0–1)
Eosinophils Absolute: 1 10*3/uL — ABNORMAL HIGH (ref 0.0–0.7)
Eosinophils Relative: 3 % (ref 0–5)
Lymphocytes Relative: 15 % (ref 12–46)
Lymphs Abs: 5.2 10*3/uL — ABNORMAL HIGH (ref 0.7–4.0)
Metamyelocytes Relative: 2 %
Monocytes Absolute: 2.1 10*3/uL — ABNORMAL HIGH (ref 0.1–1.0)
Monocytes Relative: 6 % (ref 3–12)
Myelocytes: 2 %
NEUTROS PCT: 60 % (ref 43–77)
Neutro Abs: 25.4 10*3/uL — ABNORMAL HIGH (ref 1.7–7.7)
PROMYELOCYTES ABS: 5 %
nRBC: 1 /100 WBC — ABNORMAL HIGH

## 2014-04-23 LAB — GLUCOSE, CAPILLARY: Glucose-Capillary: 205 mg/dL — ABNORMAL HIGH (ref 70–99)

## 2014-04-23 LAB — CBG MONITORING, ED: Glucose-Capillary: 291 mg/dL — ABNORMAL HIGH (ref 70–99)

## 2014-04-23 LAB — I-STAT TROPONIN, ED: TROPONIN I, POC: 0.01 ng/mL (ref 0.00–0.08)

## 2014-04-23 LAB — APTT: APTT: 25 s (ref 24–37)

## 2014-04-23 LAB — PROTIME-INR
INR: 1.06 (ref 0.00–1.49)
Prothrombin Time: 13.9 seconds (ref 11.6–15.2)

## 2014-04-23 LAB — LACTATE DEHYDROGENASE: LDH: 342 U/L — ABNORMAL HIGH (ref 94–250)

## 2014-04-23 MED ORDER — STROKE: EARLY STAGES OF RECOVERY BOOK
Freq: Once | Status: AC
  Administered 2014-04-24: 07:00:00
  Filled 2014-04-23 (×2): qty 1

## 2014-04-23 MED ORDER — PHENOL 1.4 % MT LIQD: 1.0000 | OROMUCOSAL | Status: DC | PRN

## 2014-04-23 MED ORDER — ATORVASTATIN CALCIUM 10 MG PO TABS: 10.0000 mg | ORAL_TABLET | Freq: Every day | ORAL | Status: DC

## 2014-04-23 MED ORDER — ACETAMINOPHEN 325 MG PO TABS: 650.0000 mg | ORAL_TABLET | ORAL | Status: DC | PRN

## 2014-04-23 MED ORDER — HYDRALAZINE HCL 10 MG PO TABS
10.0000 mg | ORAL_TABLET | Freq: Two times a day (BID) | ORAL | Status: DC
  Administered 2014-04-24: 10 mg via ORAL
  Filled 2014-04-23: qty 1

## 2014-04-23 MED ORDER — TORSEMIDE 20 MG PO TABS
40.0000 mg | ORAL_TABLET | Freq: Every day | ORAL | Status: DC
  Administered 2014-04-24: 40 mg via ORAL
  Filled 2014-04-23 (×2): qty 2

## 2014-04-23 MED ORDER — AMLODIPINE BESYLATE 10 MG PO TABS
10.0000 mg | ORAL_TABLET | Freq: Every day | ORAL | Status: DC
  Administered 2014-04-24: 10 mg via ORAL
  Filled 2014-04-23: qty 1

## 2014-04-23 MED ORDER — FLECAINIDE ACETATE 100 MG PO TABS
100.0000 mg | ORAL_TABLET | Freq: Two times a day (BID) | ORAL | Status: DC
  Administered 2014-04-24: 100 mg via ORAL
  Filled 2014-04-23 (×3): qty 1

## 2014-04-23 MED ORDER — ASPIRIN 300 MG RE SUPP
300.0000 mg | Freq: Once | RECTAL | Status: AC
  Administered 2014-04-24: 300 mg via RECTAL
  Filled 2014-04-23: qty 1

## 2014-04-23 MED ORDER — OXYCODONE HCL 5 MG PO TABS: 10.0000 mg | ORAL_TABLET | Freq: Two times a day (BID) | ORAL | Status: DC | PRN

## 2014-04-23 MED ORDER — INSULIN ASPART 100 UNIT/ML ~~LOC~~ SOLN
0.0000 [IU] | Freq: Four times a day (QID) | SUBCUTANEOUS | Status: DC
Start: 2014-04-23 — End: 2014-04-24
  Administered 2014-04-24: 3 [IU] via SUBCUTANEOUS
  Administered 2014-04-24: 5 [IU] via SUBCUTANEOUS
  Administered 2014-04-24: 3 [IU] via SUBCUTANEOUS

## 2014-04-23 MED ORDER — LISINOPRIL 20 MG PO TABS
40.0000 mg | ORAL_TABLET | Freq: Every day | ORAL | Status: DC
  Administered 2014-04-24: 40 mg via ORAL
  Filled 2014-04-23: qty 2

## 2014-04-23 MED ORDER — HYDROMORPHONE HCL 1 MG/ML IJ SOLN
2.0000 mg | INTRAMUSCULAR | Status: DC | PRN
  Administered 2014-04-24: 2 mg via INTRAVENOUS

## 2014-04-23 MED ORDER — DULOXETINE HCL 20 MG PO CPEP
40.0000 mg | ORAL_CAPSULE | Freq: Every day | ORAL | Status: DC
  Administered 2014-04-24: 40 mg via ORAL
  Filled 2014-04-23: qty 2

## 2014-04-23 MED ORDER — LORAZEPAM 2 MG/ML IJ SOLN
1.0000 mg | Freq: Once | INTRAMUSCULAR | Status: AC
  Administered 2014-04-23: 1 mg via INTRAVENOUS
  Filled 2014-04-23: qty 1

## 2014-04-23 MED ORDER — TRAZODONE HCL 100 MG PO TABS: 100.0000 mg | ORAL_TABLET | Freq: Every day | ORAL | Status: DC

## 2014-04-23 MED ORDER — CARVEDILOL 12.5 MG PO TABS
12.5000 mg | ORAL_TABLET | Freq: Two times a day (BID) | ORAL | Status: DC
  Administered 2014-04-24: 12.5 mg via ORAL
  Filled 2014-04-23: qty 1

## 2014-04-23 MED ORDER — SODIUM CHLORIDE 0.9 % IV BOLUS (SEPSIS): 1000.0000 mL | Freq: Once | INTRAVENOUS | Status: DC

## 2014-04-23 MED ORDER — DOCUSATE SODIUM 100 MG PO CAPS: 400.0000 mg | ORAL_CAPSULE | Freq: Every day | ORAL | Status: DC

## 2014-04-23 NOTE — Code Documentation (Signed)
Code stroke called at 1745, patient arrived to [facility] ED via [facility].  As per patient, has had a headache and heart palpations since Friday 04/21/14.  Today at 1600, patients headache became severe with left side weakness noted.  Patient was by himself and managed to call EMS himself.  NIHSS 9.

## 2014-04-23 NOTE — ED Provider Notes (Signed)
CSN: [id number]     Arrival date & time 04/23/14  1801 History   First MD Initiated Contact with Patient 04/23/14 1818     Chief Complaint  Patient presents with  . Code Stroke     (Consider location/radiation/quality/duration/timing/severity/associated sxs/prior Treatment) HPI Patient presents a code stroke. Last seen normal time is unclear, though it seems as though patient became health approximately 2 days ago, initially with weakness, then with headache. Subsequent, the patient seems to develop left sided asymmetric weakness beyond generalized concerns. No clear precipitant. Per EMS, the patient was hemodynamically stable en route, but was unable to convey his symptoms to them clearly. To me, the patient is minimally verbal, with very brief, occasional responses only. Level V caveat.  Past Medical History  Diagnosis Date  . Hypertension   . Hyperlipidemia   . Diabetes mellitus without complication   . CVA (cerebral infarction)   . PVD (peripheral vascular disease)   . Atrial flutter, paroxysmal     a. s/p failed RF ablation and placed on flecainide   . OSA (obstructive sleep apnea)   . Acute leukemia   . Coronary artery disease   . GERD (gastroesophageal reflux disease)   . H/O hiatal hernia   . Obesity    Past Surgical History  Procedure Laterality Date  . Gastric bypass    . Cardiac catheterization  07/2013  . Ablation of dysrhythmic focus  02/16/2014    ATRIAL FLUTTER       DR Caryl Comes   . Nasal sinus surgery    . Elbow surgery Right 1998  . Atrial flutter ablation N/A 02/16/2014    Procedure: ATRIAL FLUTTER ABLATION;  Surgeon: Deboraha Sprang, MD;  Location: [facility] CATH LAB;  Service: Cardiovascular;  Laterality: N/A;   History reviewed. No pertinent family history. History  Substance Use Topics  . Smoking status: Former Smoker -- 1.00 packs/day for 10 years    Types: Cigarettes    Quit date: 02/07/2001  . Smokeless tobacco: Never Used  . Alcohol Use: No     Review of Systems  Unable to perform ROS: Acuity of condition      Allergies  Codeine; Isordil; and Nifedipine  Home Medications   Prior to Admission medications   Medication Sig Start Date End Date Taking? Authorizing Provider  amLODipine (NORVASC) 10 MG tablet Take 10 mg by mouth every morning.     Historical Provider, MD  atorvastatin (LIPITOR) 10 MG tablet Take 10 mg by mouth at bedtime.     Historical Provider, MD  CALCIUM PO Take 1 tablet by mouth 2 (two) times daily.    Historical Provider, MD  carvedilol (COREG) 12.5 MG tablet Take 12.5 mg by mouth 2 (two) times daily with a meal.    Historical Provider, MD  Cyanocobalamin (VITAMIN B-12 PO) Take 1 tablet by mouth 4 (four) times a week.    Historical Provider, MD  docusate sodium (COLACE) 100 MG capsule Take 400 mg by mouth at bedtime.    Historical Provider, MD  DULoxetine (CYMBALTA) 20 MG capsule Take 40 mg by mouth daily.    Historical Provider, MD  flecainide (TAMBOCOR) 100 MG tablet Take 1 tablet (100 mg total) by mouth 2 (two) times daily. 03/07/14   Deboraha Sprang, MD  furosemide (LASIX) 80 MG tablet Take 1 tablet (80 mg total) by mouth daily. 03/07/14   Deboraha Sprang, MD  hydrALAZINE (APRESOLINE) 10 MG tablet Take 2.5 tablets (25 mg total) by mouth 2 (two)  times daily. 02/17/14   Eileen Stanford, PA-C  insulin aspart protamine- aspart (NOVOLOG MIX 70/30) (70-30) 100 UNIT/ML injection Inject 35 Units into the skin 2 (two) times daily. SLIDING SCALE    Historical Provider, MD  IRON PO Take 1 tablet by mouth 4 (four) times a week.    Historical Provider, MD  lisinopril (PRINIVIL,ZESTRIL) 40 MG tablet Take 40 mg by mouth daily.    Historical Provider, MD  oxyCODONE (OXY IR/ROXICODONE) 5 MG immediate release tablet Take 10 mg by mouth 2 (two) times daily.    Historical Provider, MD  potassium chloride SA (K-DUR,KLOR-CON) 20 MEQ tablet Take 40 mEq by mouth every morning.    Historical Provider, MD  rivaroxaban (XARELTO)  20 MG TABS tablet Take 1 tablet (20 mg total) by mouth daily with supper. 02/13/14   Deboraha Sprang, MD  traZODone (DESYREL) 100 MG tablet Take 100 mg by mouth at bedtime.    Historical Provider, MD   BP 150/58 mmHg  Pulse 60  Temp(Src) 99.4 F (37.4 C) (Oral)  Resp 21  SpO2 95% Physical Exam  Constitutional: He appears listless. No distress.  Obese elderly appearing male uncomfortable  HENT:  Head: Normocephalic and atraumatic.  Eyes: Conjunctivae and EOM are normal.  Cardiovascular: Normal rate and regular rhythm.   Pulmonary/Chest: Effort normal. No stridor. No respiratory distress.  Abdominal: He exhibits no distension.  Musculoskeletal: He exhibits no edema.  Neurological: He appears listless. He displays tremor. He displays no atrophy. No sensory deficit. He exhibits abnormal muscle tone. He displays no seizure activity.  NIH 9. Left upper and lower extremity strength is 3/5, right is 5/5. No facial asymmetry. Speech is halting, difficult to understand, brief.   Skin: Skin is warm and dry.  Psychiatric: He has a normal mood and affect. Cognition and memory are impaired.  Nursing note and vitals reviewed.   ED Course  Procedures (including critical care time) Labs Review Labs Reviewed  CBC - Abnormal; Notable for the following:    WBC 34.4 (*)    All other components within normal limits  DIFFERENTIAL - Abnormal; Notable for the following:    Basophils Relative 2 (*)    nRBC 1 (*)    Neutro Abs 25.4 (*)    Lymphs Abs 5.2 (*)    Monocytes Absolute 2.1 (*)    Eosinophils Absolute 1.0 (*)    Basophils Absolute 0.7 (*)    All other components within normal limits  COMPREHENSIVE METABOLIC PANEL - Abnormal; Notable for the following:    Sodium 132 (*)    Glucose, Bld 347 (*)    Albumin 3.4 (*)    GFR calc non Af Amer 62 (*)    GFR calc Af Amer 71 (*)    All other components within normal limits  CBC WITH DIFFERENTIAL - Abnormal; Notable for the following:    RBC  4.20 (*)    Platelets 146 (*)    All other components within normal limits  LACTATE DEHYDROGENASE - Abnormal; Notable for the following:    LDH 342 (*)    All other components within normal limits  RETICULOCYTES - Abnormal; Notable for the following:    RBC. 4.20 (*)    All other components within normal limits  GLUCOSE, CAPILLARY - Abnormal; Notable for the following:    Glucose-Capillary 205 (*)    All other components within normal limits  CBG MONITORING, ED - Abnormal; Notable for the following:    Glucose-Capillary 291 (*)  All other components within normal limits  PROTIME-INR  APTT  HEMOGLOBIN A1C  LIPID PANEL  URINE RAPID DRUG SCREEN ([place])  I-STAT TROPOININ, ED   labs notable for leukocytosis, but mild abnormalities. With initially reassuring head CT, the patient had fluid resuscitation.  Imaging Review Ct Head Wo Contrast  04/23/2014   CLINICAL DATA:  Ataxia.  Code stroke.  EXAM: CT HEAD WITHOUT CONTRAST  TECHNIQUE: Contiguous axial images were obtained from the base of the skull through the vertex without intravenous contrast.  COMPARISON:  CT head 11/04/2013.  FINDINGS: Asymmetry in the lateral ventricles is unchanged and felt to be developmental in origin. No mass lesion. No midline shift. No acute hemorrhage or hematoma. No extra-axial fluid collections. No evidence of acute infarction. Mild changes of small vessel disease of the white matter diffusely, unchanged. No focal brain parenchymal abnormality. No significant interval change.  No skull fracture or other focal osseous abnormality involving the skull. Prior bilateral maxillary sinus medial antrectomies with mucosal thickening in the right maxillary sinus. Mucosal thickening involving multiple bilateral ethmoid air cells. Mucous retention cyst or polyp in the right sphenoid sinus. Bilateral mastoid air cells and middle ear cavities well aerated. Bilateral carotid siphon and left vertebral artery  atherosclerosis.  IMPRESSION: 1. No acute intracranial abnormality. 2. Stable mild chronic microvascular ischemic changes of the white matter. 3. Mild chronic right maxillary, right sphenoid and bilateral ethmoid sinusitis. These results were called by telephone at the time of interpretation on 04/23/2014 at 6:29 pm to Dr. Carmin Muskrat, who verbally acknowledged these results.   Electronically Signed   By: Evangeline Dakin M.D.   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 Patient's evaluation here does not suggest acute stroke, but the patient required admission for further evaluation and management given his new weakness, leukocytosis, blood line abnormalities.     Carmin Muskrat, MD 04/24/14 [phone]

## 2014-04-23 NOTE — ED Notes (Signed)
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  Order received from MD.

## 2014-04-23 NOTE — ED Notes (Addendum)
Pt states he hasn't been feeling well x2 days. Reports headaches, chest pain and L sided weakness. Speech clear at present. No facial droop. L sided arm and leg drift upon arrival. Initial NIHSS of 9. Pt is alert, but confused, able to answer most questions correctly.

## 2014-04-23 NOTE — ED Notes (Addendum)
Code Stroke cancelled per Dr. Armida Sans.

## 2014-04-23 NOTE — Progress Notes (Signed)
ANTICOAGULATION CONSULT NOTE - Initial Consult  Pharmacy Consult for Xarelto Indication: atrial fibrillation/CVA   Allergies  Allergen Reactions  . Codeine Swelling  . Isordil [Isosorbide] Hives  . Nifedipine Other (See Comments)    Headache     Patient Measurements: Height: 6\' 1"  (185.4 cm) Weight: (!) 345 lb 8 oz (156.718 kg) IBW/kg (Calculated) : 79.9 Heparin Dosing Weight: n/a   Vital Signs: Temp: 98.1 F (36.7 C) (01/03 2339) Temp Source: Oral (01/03 2339) BP: 135/60 mmHg (01/03 2339) Pulse Rate: 65 (01/03 2339)  Labs:  Recent Labs  04/23/14 1810  HGB 14.5  HCT 43.8  PLT 163  APTT 25  LABPROT 13.9  INR 1.06  CREATININE 1.27    Estimated Creatinine Clearance: 101.6 mL/min (by C-G formula based on Cr of 1.27).   Medical History: Past Medical History  Diagnosis Date  . Hypertension   . Hyperlipidemia   . Diabetes mellitus without complication   . CVA (cerebral infarction)   . PVD (peripheral vascular disease)   . Atrial flutter, paroxysmal     a. s/p failed RF ablation and placed on flecainide   . OSA (obstructive sleep apnea)   . Acute leukemia   . Coronary artery disease   . GERD (gastroesophageal reflux disease)   . H/O hiatal hernia   . Obesity     Medications:  Prescriptions prior to admission  Medication Sig Dispense Refill Last Dose  . amLODipine (NORVASC) 10 MG tablet Take 10 mg by mouth daily.    04/23/2014 at Unknown time  . atorvastatin (LIPITOR) 10 MG tablet Take 10 mg by mouth at bedtime.    04/22/2014 at Unknown time  . CALCIUM PO Take 1 tablet by mouth 2 (two) times daily.   04/23/2014 at am  . carvedilol (COREG) 12.5 MG tablet Take 12.5 mg by mouth 2 (two) times daily with a meal.   04/23/2014 at 800  . Cyanocobalamin (VITAMIN B-12 PO) Take 1 tablet by mouth 3 (three) times a week. Monday, Wednesday, Friday   04/21/2013  . docusate sodium (COLACE) 100 MG capsule Take 400 mg by mouth at bedtime.   04/22/2014 at Unknown time  . DULoxetine  (CYMBALTA) 20 MG capsule Take 40 mg by mouth daily.   04/23/2014 at Unknown time  . flecainide (TAMBOCOR) 100 MG tablet Take 1 tablet (100 mg total) by mouth 2 (two) times daily. 180 tablet 3 04/23/2014 at am  . furosemide (LASIX) 80 MG tablet Take 1 tablet (80 mg total) by mouth daily. 90 tablet 3 04/23/2014 at Unknown time  . hydrALAZINE (APRESOLINE) 10 MG tablet Take 2.5 tablets (25 mg total) by mouth 2 (two) times daily. (Patient taking differently: Take 10 mg by mouth 2 (two) times daily. ) 60 tablet 11 04/23/2014 at am  . insulin aspart protamine- aspart (NOVOLOG MIX 70/30) (70-30) 100 UNIT/ML injection Inject 35-50 Units into the skin 2 (two) times daily. Based on sugar levels   04/23/2014 at am  . insulin regular (NOVOLIN R,HUMULIN R) 100 units/mL injection Inject 8 Units into the skin daily before breakfast.   04/23/2014 at Unknown time  . IRON PO Take 1 tablet by mouth 4 (four) times a week. Sunday, Tuesday, Thursday, Saturday   04/23/2014 at Unknown time  . lisinopril (PRINIVIL,ZESTRIL) 40 MG tablet Take 40 mg by mouth daily.   04/23/2014 at Unknown time  . oxyCODONE (OXY IR/ROXICODONE) 5 MG immediate release tablet Take 10 mg by mouth 2 (two) times daily. scheduled   04/23/2014  at 1500  . potassium chloride SA (K-DUR,KLOR-CON) 20 MEQ tablet Take 40 mEq by mouth daily.    04/23/2014 at Unknown time  . torsemide (DEMADEX) 20 MG tablet Take 40 mg by mouth daily.   04/23/2014 at Unknown time  . traZODone (DESYREL) 100 MG tablet Take 100 mg by mouth at bedtime.   04/22/2014 at Unknown time  . rivaroxaban (XARELTO) 20 MG TABS tablet Take 1 tablet (20 mg total) by mouth daily with supper. (Patient not taking: Reported on 04/23/2014) 30 tablet 6 Not Taking at Unknown time    Assessment: 45 YOM who presented with a code stroke. CT without brain abnormality. MRI pending. Patient was on Coumadin in the past but has not been taking the medication in the past few weeks. INR on admission is 1.06. Patient now to start on Xarelto  for AFib. CrCl ~ 101 mL/min. H/H and Plt wnl.   Goal of Therapy:  Stroke prevention  Monitor platelets by anticoagulation protocol: Yes   Plan:  -Start Xarelto 20 mg daily with breakfast  -Monitor CBC and s/s of bleeding   Albertina Parr, PharmD., BCPS Clinical Pharmacist Pager [phone]

## 2014-04-23 NOTE — ED Notes (Signed)
Patient transported to MRI 

## 2014-04-23 NOTE — ED Notes (Signed)
Pt presents to department via [facility], Code Stroke called @ 17:45, last seen normal unknown, pt states he hasn't been feeling well x2 days. Upon arrival L sided weakness noted.

## 2014-04-23 NOTE — Consult Note (Signed)
Referring Physician: ED    Chief Complaint: code stroke, HA, left hemiparesis, dyp  HPI:                                                                                                                                         Jeffrey Hancock is an 59 y.o. male with a past medical history significant for HTN, hyperlipidemia, atrial fibrillation off anticoagulants, CAD, cerebral infarct without residual deficits, OSA, GERD, brought in via EMS due to acute onset of the above stated symptoms. Patient lives by himself and said that he has been having HA and " heart issues" for couple of days. He called ambulance complaining of severe HA and left sided weakness. When EMS arrived found him sitting in a chair, able to follow commands but having trouble expressing himself. NIHSS 9. CT brain showed no acute abnormality. Denies vertigo, double vision, difficulty swallowing, visual disturbances, or confusion. Date last known well: 04/21/14 Time last known well: uncertain tPA Given: no, out of the window NIHSS: 9   Past Medical History  Diagnosis Date  . Hypertension   . Hyperlipidemia   . Diabetes mellitus without complication   . CVA (cerebral infarction)   . PVD (peripheral vascular disease)   . Atrial flutter, paroxysmal     a. s/p failed RF ablation and placed on flecainide   . OSA (obstructive sleep apnea)   . Acute leukemia   . Coronary artery disease   . GERD (gastroesophageal reflux disease)   . H/O hiatal hernia   . Obesity     Past Surgical History  Procedure Laterality Date  . Gastric bypass    . Cardiac catheterization  07/2013  . Ablation of dysrhythmic focus  02/16/2014    ATRIAL FLUTTER       DR Caryl Comes   . Nasal sinus surgery    . Elbow surgery Right 1998  . Atrial flutter ablation N/A 02/16/2014    Procedure: ATRIAL FLUTTER ABLATION;  Surgeon: Deboraha Sprang, MD;  Location: [facility] CATH LAB;  Service: Cardiovascular;  Laterality: N/A;    History reviewed. No pertinent family  history. Social History:  reports that he quit smoking about 13 years ago. His smoking use included Cigarettes. He has a 10 pack-year smoking history. He has never used smokeless tobacco. He reports that he does not drink alcohol or use illicit drugs.  Allergies:  Allergies  Allergen Reactions  . Codeine Swelling  . Isordil [Isosorbide]     Hives   . Nifedipine Other (See Comments)    Headache     Medications:  I have reviewed the patient's current medications.  ROS:                                                                                                                                       History obtained from EMS and the patient  General ROS: negative for - chills, fatigue, fever, night sweats, or weight loss Psychological ROS: negative for - behavioral disorder, hallucinations, memory difficulties, mood swings or suicidal ideation Ophthalmic ROS: negative for - blurry vision, double vision, eye pain or loss of vision ENT ROS: negative for - epistaxis, nasal discharge, oral lesions, sore throat, tinnitus or vertigo Allergy and Immunology ROS: negative for - hives or itchy/watery eyes Hematological and Lymphatic ROS: negative for - bleeding problems, bruising or swollen lymph nodes Endocrine ROS: negative for - galactorrhea, hair pattern changes, polydipsia/polyuria or temperature intolerance Respiratory ROS: negative for - cough, hemoptysis, shortness of breath or wheezing Cardiovascular ROS: negative for - dyspnea on exertion Gastrointestinal ROS: negative for - abdominal pain, diarrhea, hematemesis, nausea/vomiting or stool incontinence Genito-Urinary ROS: negative for - dysuria, hematuria, incontinence or urinary frequency/urgency Musculoskeletal ROS: negative for - joint swelling or muscular weakness Neurological ROS: as noted in HPI Dermatological  ROS: negative for rash and skin lesion changes  Physical exam: pleasant male in no apparent distress. BP 150/58  P57 R 17 afebrile Head: normocephalic. Neck: supple, no bruits, no JVD. Cardiac: no murmurs. Lungs: clear. Abdomen: soft, no tender, no mass. Extremities: no edema. Skin: no edema Neurologic Examination:                                                                                                      General: Mental Status: Alert, oriented, thought content appropriate.  Speech fluent without evidence of aphasia.  Able to follow 3 step commands without difficulty. Cranial Nerves: II: Discs flat bilaterally; Visual fields grossly normal, pupils equal, round, reactive to light and accommodation III,IV, VI: ptosis not present, extra-ocular motions intact bilaterally V,VII: smile symmetric, facial light touch sensation normal bilaterally VIII: hearing normal bilaterally IX,X: gag reflex present XI: bilateral shoulder shrug XII: midline tongue extension without atrophy or fasciculations  Motor: Significant for left hemiparesis Tone and bulk:normal tone throughout; no atrophy noted Sensory: Pinprick and light touch intact throughout, bilaterally Deep Tendon Reflexes:  1 all over Plantars: Right: downgoing   Left: downgoing Cerebellar: normal finger-to-nose,  normal heel-to-shin test Gait:  No tested due to multiple leads  No results found for this or any  previous visit (from the past 48 hour(s)). No results found.   Assessment: 59 y.o. male with multiple risk factors for stroke, brought in as a code stroke due to HA, left hemiparesis, and language impairment. NIHSS 9. He is not really aphasic. CT brain without acute abnormality. Suspect right brain infarct but patient was last known well 04/21/14, hence no a candidate for intravenous or IA thrombolysis. Will admit to medicine and complete stroke work up  Stroke Risk Factors - HTN, hyperlipidemia, atrial fibrillation,  CAD, prior stroke, obesity  Plan: 1. HgbA1c, fasting lipid panel 2. MRI, MRA  of the brain without contrast 3. Echocardiogram 4. Carotid dopplers 5. Prophylactic therapy-xarelto as per stroke team recommendations. 6. Risk factor modification 7. Telemetry monitoring 8. Frequent neuro checks 9. PT/OT SLP   Dorian Pod ,MD [facility] [phone]  04/23/2014, 6:30 PM

## 2014-04-24 DIAGNOSIS — I639 Cerebral infarction, unspecified: Secondary | ICD-10-CM | POA: Diagnosis not present

## 2014-04-24 DIAGNOSIS — E119 Type 2 diabetes mellitus without complications: Secondary | ICD-10-CM | POA: Diagnosis not present

## 2014-04-24 DIAGNOSIS — C9210 Chronic myeloid leukemia, BCR/ABL-positive, not having achieved remission: Secondary | ICD-10-CM | POA: Diagnosis present

## 2014-04-24 DIAGNOSIS — G459 Transient cerebral ischemic attack, unspecified: Secondary | ICD-10-CM | POA: Diagnosis present

## 2014-04-24 DIAGNOSIS — C9290 Myeloid leukemia, unspecified, not having achieved remission: Secondary | ICD-10-CM | POA: Diagnosis not present

## 2014-04-24 DIAGNOSIS — I4892 Unspecified atrial flutter: Secondary | ICD-10-CM | POA: Diagnosis not present

## 2014-04-24 DIAGNOSIS — I10 Essential (primary) hypertension: Secondary | ICD-10-CM | POA: Diagnosis not present

## 2014-04-24 DIAGNOSIS — M6289 Other specified disorders of muscle: Secondary | ICD-10-CM | POA: Diagnosis not present

## 2014-04-24 LAB — RETICULOCYTES
RBC.: 4.2 MIL/uL — ABNORMAL LOW (ref 4.22–5.81)
Retic Count, Absolute: 75.6 10*3/uL (ref 19.0–186.0)
Retic Ct Pct: 1.8 % (ref 0.4–3.1)

## 2014-04-24 LAB — COMPREHENSIVE METABOLIC PANEL
ALT: 19 U/L (ref 0–53)
ANION GAP: 7 (ref 5–15)
AST: 21 U/L (ref 0–37)
Albumin: 3.1 g/dL — ABNORMAL LOW (ref 3.5–5.2)
Alkaline Phosphatase: 61 U/L (ref 39–117)
BUN: 11 mg/dL (ref 6–23)
CALCIUM: 8.4 mg/dL (ref 8.4–10.5)
CO2: 27 mmol/L (ref 19–32)
Chloride: 103 mEq/L (ref 96–112)
Creatinine, Ser: 0.96 mg/dL (ref 0.50–1.35)
GFR calc non Af Amer: >90 mL/min (ref >90)
GLUCOSE: 186 mg/dL — AB (ref 70–99)
Potassium: 4 mmol/L (ref 3.5–5.1)
SODIUM: 137 mmol/L (ref 135–145)
Total Bilirubin: 0.7 mg/dL (ref 0.3–1.2)
Total Protein: 6.3 g/dL (ref 6.0–8.3)

## 2014-04-24 LAB — CBC WITH DIFFERENTIAL/PLATELET
BLASTS: 0 %
Band Neutrophils: 0 % (ref 0–10)
Basophils Absolute: 0 10*3/uL (ref 0.0–0.1)
Basophils Relative: 0 % (ref 0–1)
EOS PCT: 2 % (ref 0–5)
Eosinophils Absolute: 0.6 10*3/uL (ref 0.0–0.7)
HCT: 40.5 % (ref 39.0–52.0)
Hemoglobin: 13.1 g/dL (ref 13.0–17.0)
LYMPHS PCT: 9 % — AB (ref 12–46)
Lymphs Abs: 2.9 10*3/uL (ref 0.7–4.0)
MCH: 31.2 pg (ref 26.0–34.0)
MCHC: 32.3 g/dL (ref 30.0–36.0)
MCV: 96.4 fL (ref 78.0–100.0)
METAMYELOCYTES PCT: 0 %
Monocytes Absolute: 3.9 10*3/uL — ABNORMAL HIGH (ref 0.1–1.0)
Monocytes Relative: 12 % (ref 3–12)
Myelocytes: 0 %
NEUTROS ABS: 24.7 10*3/uL — AB (ref 1.7–7.7)
Neutrophils Relative %: 77 % (ref 43–77)
PLATELETS: 146 10*3/uL — AB (ref 150–400)
Promyelocytes Absolute: 0 %
RBC: 4.2 MIL/uL — AB (ref 4.22–5.81)
RDW: 14.2 % (ref 11.5–15.5)
WBC: 32.1 10*3/uL — AB (ref 4.0–10.5)
nRBC: 0 /100 WBC

## 2014-04-24 LAB — HEMOGLOBIN A1C
Hgb A1c MFr Bld: 8.1 % — ABNORMAL HIGH (ref <5.7)
Mean Plasma Glucose: 186 mg/dL — ABNORMAL HIGH (ref <117)

## 2014-04-24 LAB — CBC
HCT: 42.6 % (ref 39.0–52.0)
Hemoglobin: 14 g/dL (ref 13.0–17.0)
MCH: 31.9 pg (ref 26.0–34.0)
MCHC: 32.9 g/dL (ref 30.0–36.0)
MCV: 97 fL (ref 78.0–100.0)
PLATELETS: 156 10*3/uL (ref 150–400)
RBC: 4.39 MIL/uL (ref 4.22–5.81)
RDW: 14.3 % (ref 11.5–15.5)
WBC: 31.9 10*3/uL — ABNORMAL HIGH (ref 4.0–10.5)

## 2014-04-24 LAB — LIPID PANEL
CHOL/HDL RATIO: 4.6 ratio
Cholesterol: 132 mg/dL (ref 0–200)
HDL: 29 mg/dL — ABNORMAL LOW (ref >39)
LDL Cholesterol: 73 mg/dL (ref 0–99)
Triglycerides: 150 mg/dL — ABNORMAL HIGH (ref <150)
VLDL: 30 mg/dL (ref 0–40)

## 2014-04-24 LAB — GLUCOSE, CAPILLARY
GLUCOSE-CAPILLARY: 199 mg/dL — AB (ref 70–99)
Glucose-Capillary: 167 mg/dL — ABNORMAL HIGH (ref 70–99)

## 2014-04-24 LAB — RAPID URINE DRUG SCREEN, HOSP PERFORMED
Amphetamines: NOT DETECTED
Barbiturates: NOT DETECTED
Benzodiazepines: NOT DETECTED
COCAINE: NOT DETECTED
OPIATES: NOT DETECTED
Tetrahydrocannabinol: NOT DETECTED

## 2014-04-24 MED ORDER — HYDROMORPHONE HCL 1 MG/ML IJ SOLN
INTRAMUSCULAR | Status: AC
  Filled 2014-04-24: qty 2

## 2014-04-24 MED ORDER — RIVAROXABAN 20 MG PO TABS
20.0000 mg | ORAL_TABLET | Freq: Every day | ORAL | Status: DC
  Administered 2014-04-24: 20 mg via ORAL
  Filled 2014-04-24: qty 1

## 2014-04-24 MED ORDER — HYDROMORPHONE HCL 1 MG/ML IJ SOLN: 0.5000 mg | INTRAMUSCULAR | Status: DC | PRN

## 2014-04-24 NOTE — Progress Notes (Signed)
*  PRELIMINARY RESULTS* Vascular Ultrasound Carotid Duplex (Doppler) has been completed.  Preliminary findings: Bilaterally 1-39% ICA stenosis, although the left ICA is borderline >40%. Antegrade right vertebral. 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  Landry Mellow, RDMS, RVT  04/24/2014, 10:25 AM

## 2014-04-24 NOTE — H&P (Addendum)
[facility] History and Physical  Patient: Jeffrey Hancock  QJJ:[id number]  DOB: [birth year]  DOS: the patient was seen and examined on 04/23/2014 PCP: Hilbert Corrigan, MD  Chief Complaint: Left-sided weakness  HPI: Jeffrey Hancock is a 59 y.o. male with Past medical history of hypertension, dyslipidemia, diabetes mellitus, history of CVA, peripheral vascular disease, a flutter and A. 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 The patient is presenting with complaints of left-sided numbness and weakness that has been ongoing since January 1. He denies any fall trauma or injury. He complains of headache and was having difficulty walking around and therefore he decided to come to the ER. Patient complains of some blurred vision and feels like he is seeing through a frosted glass. This also has been present since last to 3 days. He complains of some sore throat and mentions it is burning and makes him nauseated whenever he tries to eat or drink anything.  He denies any chest pain abdominal pain nausea vomiting diarrhea or burning urination.  He was initially on Xarelto and at the [place] he was switched from Xarelto to Coumadin and since he did not wanted to go for an INR checkup at [place] he stopped taking Coumadin on December 24. Patient had a failed catheter ablation of a flutter in October 15, and was supposed to be on flecainide which she's taking.  Patient also was recently diagnosed in September 15 at [facility] with CML, and was supposed to be on imatinib after a bone marrow biopsy to get a baseline, but unfortunately because of him being on anticoagulation biopsy was unable to be performed. Thus he is not taking any chemotherapy at present. He mentions is compliant with all other medications as well as a C Pap.  The patient is coming from home. And at his baseline independent for  most of his ADL.  Review of Systems: as mentioned in the history of present illness.  A Comprehensive review of the other systems is negative.  Past Medical History  Diagnosis Date  . Hypertension   . Hyperlipidemia   . Diabetes mellitus without complication   . CVA (cerebral infarction)   . PVD (peripheral vascular disease)   . Atrial flutter, paroxysmal     a. s/p failed RF ablation and placed on flecainide   . OSA (obstructive sleep apnea)   . Acute leukemia   . Coronary artery disease   . GERD (gastroesophageal reflux disease)   . H/O hiatal hernia   . Obesity    Past Surgical History  Procedure Laterality Date  . Gastric bypass    . Cardiac catheterization  07/2013  . Ablation of dysrhythmic focus  02/16/2014    ATRIAL FLUTTER       DR Caryl Comes   . Nasal sinus surgery    . Elbow surgery Right 1998  . Atrial flutter ablation N/A 02/16/2014    Procedure: ATRIAL FLUTTER ABLATION;  Surgeon: Deboraha Sprang, MD;  Location: [facility] CATH LAB;  Service: Cardiovascular;  Laterality: N/A;   Social History:  reports that he quit smoking about 13 years ago. His smoking use included Cigarettes. He has a 10 pack-year smoking history. He has never used smokeless tobacco. He reports that he does not drink alcohol or use illicit drugs.  Allergies  Allergen Reactions  . Codeine Swelling  . Isordil [Isosorbide] Hives  . Nifedipine Other (See  Comments)    Headache     History reviewed. No pertinent family history.  Prior to Admission medications   Medication Sig Start Date End Date Taking? Authorizing Provider  amLODipine (NORVASC) 10 MG tablet Take 10 mg by mouth daily.    Yes Historical Provider, MD  atorvastatin (LIPITOR) 10 MG tablet Take 10 mg by mouth at bedtime.    Yes Historical Provider, MD  CALCIUM PO Take 1 tablet by mouth 2 (two) times daily.   Yes Historical Provider, MD  carvedilol (COREG) 12.5 MG tablet Take 12.5 mg by mouth 2 (two) times daily with a meal.   Yes Historical  Provider, MD  Cyanocobalamin (VITAMIN B-12 PO) Take 1 tablet by mouth 3 (three) times a week. Monday, Wednesday, Friday   Yes Historical Provider, MD  docusate sodium (COLACE) 100 MG capsule Take 400 mg by mouth at bedtime.   Yes Historical Provider, MD  DULoxetine (CYMBALTA) 20 MG capsule Take 40 mg by mouth daily.   Yes Historical Provider, MD  flecainide (TAMBOCOR) 100 MG tablet Take 1 tablet (100 mg total) by mouth 2 (two) times daily. 03/07/14  Yes Deboraha Sprang, MD  furosemide (LASIX) 80 MG tablet Take 1 tablet (80 mg total) by mouth daily. 03/07/14  Yes Deboraha Sprang, MD  hydrALAZINE (APRESOLINE) 10 MG tablet Take 2.5 tablets (25 mg total) by mouth 2 (two) times daily. Patient taking differently: Take 10 mg by mouth 2 (two) times daily.  02/17/14  Yes Eileen Stanford, PA-C  insulin aspart protamine- aspart (NOVOLOG MIX 70/30) (70-30) 100 UNIT/ML injection Inject 35-50 Units into the skin 2 (two) times daily. Based on sugar levels   Yes Historical Provider, MD  insulin regular (NOVOLIN R,HUMULIN R) 100 units/mL injection Inject 8 Units into the skin daily before breakfast.   Yes Historical Provider, MD  IRON PO Take 1 tablet by mouth 4 (four) times a week. Sunday, Tuesday, Thursday, Saturday   Yes Historical Provider, MD  lisinopril (PRINIVIL,ZESTRIL) 40 MG tablet Take 40 mg by mouth daily.   Yes Historical Provider, MD  oxyCODONE (OXY IR/ROXICODONE) 5 MG immediate release tablet Take 10 mg by mouth 2 (two) times daily. scheduled   Yes Historical Provider, MD  potassium chloride SA (K-DUR,KLOR-CON) 20 MEQ tablet Take 40 mEq by mouth daily.    Yes Historical Provider, MD  torsemide (DEMADEX) 20 MG tablet Take 40 mg by mouth daily.   Yes Historical Provider, MD  traZODone (DESYREL) 100 MG tablet Take 100 mg by mouth at bedtime.   Yes Historical Provider, MD  rivaroxaban (XARELTO) 20 MG TABS tablet Take 1 tablet (20 mg total) by mouth daily with supper. Patient not taking: Reported on  04/23/2014 02/13/14   Deboraha Sprang, MD    Physical Exam: Filed Vitals:   04/23/14 2300 04/23/14 2315 04/23/14 2339 04/24/14 0020  BP: 135/60 134/60 135/60   Pulse: 62 61 65 62  Temp:   98.1 F (36.7 C)   TempSrc:   Oral   Resp: _0 Height:   _1  (1.854 m)   Weight:   156.718 kg (345 lb 8 oz)   SpO2: 96% 96% 100% 95%    General: Alert, Awake and Oriented to Time, Place and Person. Appear in mild distress Eyes: PERRL ENT: Oral Mucosa clear moist. Neck: no JVD Cardiovascular: S1 and S2 Present, no Murmur, Peripheral Pulses Present Respiratory: Bilateral Air entry equal and Decreased, Clear to Auscultation, noCrackles, no wheezes Abdomen: Bowel  Sound present, Soft and non tender Skin: no Rash Extremities: Trace Pedal edema, no calf tenderness Neurologic: Appears tired and lethargic. Has left-sided weakness of both upper and lower extremity. Left-sided facial numbness and tingling on the left upper and lower extremity reflexes present Babinski negative  Labs on Admission:  CBC:  Recent Labs Lab 04/23/14 1810 04/23/14 2324  WBC 34.4* PENDING  NEUTROABS 25.4* PENDING  HGB 14.5 13.1  HCT 43.8 40.5  MCV 96.5 96.4  PLT 163 146*    CMP     Component Value Date/Time   NA 132* 04/23/2014 1810   K 4.2 04/23/2014 1810   CL 96 04/23/2014 1810   CO2 31 04/23/2014 1810   GLUCOSE 347* 04/23/2014 1810   BUN 13 04/23/2014 1810   CREATININE 1.27 04/23/2014 1810   CALCIUM 8.5 04/23/2014 1810   PROT 6.5 04/23/2014 1810   ALBUMIN 3.4* 04/23/2014 1810   AST 22 04/23/2014 1810   ALT 22 04/23/2014 1810   ALKPHOS 77 04/23/2014 1810   BILITOT 0.5 04/23/2014 1810   GFRNONAA 62* 04/23/2014 1810   GFRAA 71* 04/23/2014 1810    No results for input(s): LIPASE, AMYLASE in the last 168 hours. No results for input(s): AMMONIA in the last 168 hours.  No results for input(s): CKTOTAL, CKMB, CKMBINDEX, TROPONINI in the last 168 hours. BNP (last 3 results) No results for  input(s): PROBNP in the last 8760 hours.  Radiological Exams on Admission: Ct Head Wo Contrast  04/23/2014   CLINICAL DATA:  Ataxia.  Code stroke.  EXAM: CT HEAD WITHOUT CONTRAST  TECHNIQUE: Contiguous axial images were obtained from the base of the skull through the vertex without intravenous contrast.  COMPARISON:  CT head 11/04/2013.  FINDINGS: Asymmetry in the lateral ventricles is unchanged and felt to be developmental in origin. No mass lesion. No midline shift. No acute hemorrhage or hematoma. No extra-axial fluid collections. No evidence of acute infarction. Mild changes of small vessel disease of the white matter diffusely, unchanged. No focal brain parenchymal abnormality. No significant interval change.  No skull fracture or other focal osseous abnormality involving the skull. Prior bilateral maxillary sinus medial antrectomies with mucosal thickening in the right maxillary sinus. Mucosal thickening involving multiple bilateral ethmoid air cells. Mucous retention cyst or polyp in the right sphenoid sinus. Bilateral mastoid air cells and middle ear cavities well aerated. Bilateral carotid siphon and left vertebral artery atherosclerosis.  IMPRESSION: 1. No acute intracranial abnormality. 2. Stable mild chronic microvascular ischemic changes of the white matter. 3. Mild chronic right maxillary, right sphenoid and bilateral ethmoid sinusitis. These results were called by telephone at the time of interpretation on 04/23/2014 at 6:29 pm to Dr. Carmin Muskrat, who verbally acknowledged these results.   Electronically Signed   By: Evangeline Dakin M.D.   On: 04/23/2014 18:30   Mr Jodene Nam Head Wo Contrast  04/23/2014   CLINICAL DATA:  Initial evaluation for acute onset severe headache with left-sided weakness.  EXAM: MRI HEAD WITHOUT CONTRAST  MRA HEAD WITHOUT CONTRAST  TECHNIQUE: Multiplanar, multiecho pulse sequences of the brain and surrounding structures were obtained without intravenous contrast.  Angiographic images of the head were obtained using MRA technique without contrast.  COMPARISON:  Prior CT from earlier the same day.  FINDINGS: MRI HEAD FINDINGS  The CSF containing spaces are within normal limits for patient age. Patchy T2/FLAIR hyperintensity within the periventricular deep white matter both cerebral hemispheres most consistent with chronic small vessel ischemic disease, mild for patient age.  No mass lesion, midline shift, or extra-axial fluid collection. Ventricles are normal in size without evidence of hydrocephalus.  No diffusion-weighted signal abnormality is identified to suggest acute intracranial infarct. Gray-white matter differentiation is maintained. Normal flow voids are seen within the intracranial vasculature. No intracranial hemorrhage identified. Remote left cerebellar infarct present.  The cervicomedullary junction is normal. Pituitary gland is within normal limits. Pituitary stalk is midline. The globes and optic nerves demonstrate a normal appearance with normal signal intensity.  The bone marrow signal intensity is normal. Calvarium is intact. Visualized upper cervical spine is within normal limits.  Scalp soft tissues are unremarkable.  Small retention cyst noted within the right sphenoid sinus. Scattered mucoperiosteal thickening present within the ethmoidal air cells and right maxillary sinus. No air-fluid levels. Scattered fluid density present within the mastoid air cells bilaterally.  MRA HEAD FINDINGS  ANTERIOR CIRCULATION:  Visualized distal cervical segments of the internal carotid arteries are widely patent with antegrade flow. The petrous, cavernous, and supra clinoid segments are widely patent bilaterally without significant stenosis or atheromatous disease. A1 segments, anterior communicating artery, and anterior cerebral arteries well opacified.  M1 segments well opacified bilaterally without proximal branch occlusion or hemodynamically significant stenosis.  There is question of a mild short-segment stenosis within the left M1 segment versus artifact (series 802, image 16). No aneurysm at the MCA bifurcations. Evaluation the distal MCA branches somewhat limited due to motion artifact, but are grossly normal in appearance and patent.  POSTERIOR CIRCULATION:  The left vertebral artery is likely occluded in the neck, similar to prior study. The right vertebral artery is widely patent to the level of the vertebrobasilar junction. The right posterior inferior cerebellar artery is widely patent the left posterior inferior cerebral artery not visualized. Vertebrobasilar junction and basilar artery within normal limits. Superior cerebellar artery is well opacified proximally. The posterior cerebral arteries are widely patent bilaterally. Irregularity within the distal PCA branches felt to be related to motion artifact.  No aneurysm or vascular malformation.  IMPRESSION: MRI HEAD IMPRESSION:  1. No acute intracranial infarct or other abnormality. 2. Remote left cerebellar infarct. 3. Mild chronic small vessel ischemic disease. 4. Mild right maxillary and ethmoidal sinus disease.  MRA HEAD IMPRESSION:  1. No proximal branch occlusion or hemodynamically significant stenosis identified within the intracranial circulation. 2. 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 The right vertebral artery is widely patent.   Electronically Signed   By: Jeannine Boga M.D.   On: 04/23/2014 21:50   Mr Brain Wo Contrast  04/23/2014   CLINICAL DATA:  Initial evaluation for acute onset severe headache with left-sided weakness.  EXAM: MRI HEAD WITHOUT CONTRAST  MRA HEAD WITHOUT CONTRAST  TECHNIQUE: Multiplanar, multiecho pulse sequences of the brain and surrounding structures were obtained without intravenous contrast. Angiographic images of the head were obtained using MRA technique without contrast.  COMPARISON:  Prior CT  from earlier the same day.  FINDINGS: MRI HEAD FINDINGS  The CSF containing spaces are within normal limits for patient age. Patchy T2/FLAIR hyperintensity within the periventricular deep white matter both cerebral hemispheres most consistent with chronic small vessel ischemic disease, mild for patient age.  No mass lesion, midline shift, or extra-axial fluid collection. Ventricles are normal in size without evidence of hydrocephalus.  No diffusion-weighted signal abnormality is identified to suggest acute intracranial infarct. Gray-white matter differentiation is maintained. Normal flow voids are seen within the intracranial vasculature. No  intracranial hemorrhage identified. Remote left cerebellar infarct present.  The cervicomedullary junction is normal. Pituitary gland is within normal limits. Pituitary stalk is midline. The globes and optic nerves demonstrate a normal appearance with normal signal intensity.  The bone marrow signal intensity is normal. Calvarium is intact. Visualized upper cervical spine is within normal limits.  Scalp soft tissues are unremarkable.  Small retention cyst noted within the right sphenoid sinus. Scattered mucoperiosteal thickening present within the ethmoidal air cells and right maxillary sinus. No air-fluid levels. Scattered fluid density present within the mastoid air cells bilaterally.  MRA HEAD FINDINGS  ANTERIOR CIRCULATION:  Visualized distal cervical segments of the internal carotid arteries are widely patent with antegrade flow. The petrous, cavernous, and supra clinoid segments are widely patent bilaterally without significant stenosis or atheromatous disease. A1 segments, anterior communicating artery, and anterior cerebral arteries well opacified.  M1 segments well opacified bilaterally without proximal branch occlusion or hemodynamically significant stenosis. There is question of a mild short-segment stenosis within the left M1 segment versus artifact (series 802,  image 16). No aneurysm at the MCA bifurcations. Evaluation the distal MCA branches somewhat limited due to motion artifact, but are grossly normal in appearance and patent.  POSTERIOR CIRCULATION:  The left vertebral artery is likely occluded in the neck, similar to prior study. The right vertebral artery is widely patent to the level of the vertebrobasilar junction. The right posterior inferior cerebellar artery is widely patent the left posterior inferior cerebral artery not visualized. Vertebrobasilar junction and basilar artery within normal limits. Superior cerebellar artery is well opacified proximally. The posterior cerebral arteries are widely patent bilaterally. Irregularity within the distal PCA branches felt to be related to motion artifact.  No aneurysm or vascular malformation.  IMPRESSION: MRI HEAD IMPRESSION:  1. No acute intracranial infarct or other abnormality. 2. Remote left cerebellar infarct. 3. Mild chronic small vessel ischemic disease. 4. Mild right maxillary and ethmoidal sinus disease.  MRA HEAD IMPRESSION:  1. No proximal branch occlusion or hemodynamically significant stenosis identified within the intracranial circulation. 2. 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 The right vertebral artery is widely patent.   Electronically Signed   By: Jeannine Boga M.D.   On: 04/23/2014 21:50   EKG: Independently reviewed. normal sinus rhythm, nonspecific ST and T waves changes.  Assessment/Plan Principal Problem:   Left-sided weakness Active Problems:   Essential hypertension   CVA (cerebral infarction)   Coronary artery disease   Diabetes mellitus without complication   OSA (obstructive sleep apnea)   Atrial flutter, paroxysmal   TIA (transient ischemic attack)   CML (chronic myeloid leukemia)   1. Left-sided weakness The patient is presenting with complaints of left-sided weakness. His workup so far  shows a negative CT scan as well as a negative MRI for stroke, he is MRA shows chronic left vertebral artery without any acute stenosis or occlusion. The possibility of the CVAs less likely. Patient does have history of CML but his WBC are significantly less than 100,000, suggesting possibility of hyper leukocytosis/leukostasis causing patient's current presentation is also unlikely. With this the patient will be admitted in the hospital for further workup. Will get an echocardiogram carotid Doppler in the morning. We will follow neurology recommendation. Patient will be restarted on Xarelto per neurology. PTOT consultation in the morning. Speech therapy consultation in the morning as well. Check strep throat for sore throat.  2. Diabetes mellitus. Placing the  patient on sliding scale. Check hemoglobin A1c.  3. Essential hypertension. Continuing home medications starting tomorrow. Blood pressure at present stable.  4. Atrial flutter, A. fib Patient is restarted back on chronic anticoagulation with Xarelto per neurology. Continue with flecainide.  5. Chronic diastolic heart failure. Patient was initially on Lasix by his cardiologist which was switched to torsemide few weeks ago. At present I would continue him on torsemide. Monitor ins and outs and daily weight. Avoid unnecessary IV hydration.  6. CML. Patient was recently diagnosed with CML but unable to obtain a bone marrow biopsy. We will consult with hematology in the morning.  Advance goals of care discussion: Full code   Consults: Neurology  DVT Prophylaxis:on chronic anticoagulation Nutrition: Nothing by mouth except medication pending stroke evaluation  Disposition: Admitted to observation in telemetry unit.  Author: Berle Mull, MD [facility] Pager: [phone]   Addendum: Discussed with hematology oncology on the phone, with WBC of 34,000 less likely of patient having any leukostasis. Patient has  already established care with the [place] in [place] recommend continue care with the same provider. Need to verify documents from [place] about whether or not they initiated treatment on imatinib, to verify patient compliance. Anticoagulation should not be a hindrance to bone marrow biopsy.  Jeffrey Hancock 6:55 AM 04/24/2014   If 7PM-7AM, please contact night-coverage [email or web address] Password [email or web address]

## 2014-04-24 NOTE — Discharge Instructions (Signed)
Transient Ischemic Attack A transient ischemic attack (TIA) is a "warning stroke" that causes stroke-like symptoms. Unlike a stroke, a TIA does not cause permanent damage to the brain. The symptoms of a TIA can happen very fast and do not last long. It is important to know the symptoms of a TIA and what to do. This can help prevent a major stroke or death. CAUSES   A TIA is caused by a temporary blockage in an artery in the brain or neck (carotid artery). The blockage does not allow the brain to get the blood supply it needs and can cause different symptoms. The blockage can be caused by either:  A blood clot.  Fatty buildup (plaque) in a neck or brain artery. RISK FACTORS  High blood pressure (hypertension).  High cholesterol.  Diabetes mellitus.  Heart disease.  The build up of plaque in the blood vessels (peripheral artery disease or atherosclerosis).  The build up of plaque in the blood vessels providing blood and oxygen to the brain (carotid artery stenosis).  An abnormal heart rhythm (atrial fibrillation).  Obesity.  Smoking.  Taking oral contraceptives (especially in combination with smoking).  Physical inactivity.  A diet high in fats, salt (sodium), and calories.  Alcohol use.  Use of illegal drugs (especially cocaine and methamphetamine).  Being male.  Being African American.  Being over the age of 67.  Family history of stroke.  Previous history of blood clots, stroke, TIA, or heart attack.  Sickle cell disease. SYMPTOMS  TIA symptoms are the same as a stroke but are temporary. These symptoms usually develop suddenly, or may be newly present upon awakening from sleep:  Sudden weakness or numbness of the face, arm, or leg, especially on one side of the body.  Sudden trouble walking or difficulty moving arms or legs.  Sudden confusion.  Sudden personality changes.  Trouble speaking (aphasia) or understanding.  Difficulty swallowing.  Sudden  trouble seeing in one or both eyes.  Double vision.  Dizziness.  Loss of balance or coordination.  Sudden severe headache with no known cause.  Trouble reading or writing.  Loss of bowel or bladder control.  Loss of consciousness. DIAGNOSIS  Your caregiver may be able to determine the presence or absence of a TIA based on your symptoms, history, and physical exam. Computed tomography (CT scan) of the brain is usually performed to help identify a TIA. Other tests may be done to diagnose a TIA. These tests may include:  Electrocardiography.  Continuous heart monitoring.  Echocardiography.  Carotid ultrasonography.  Magnetic resonance imaging (MRI).  A scan of the brain circulation.  Blood tests. PREVENTION  The risk of a TIA can be decreased by appropriately treating high blood pressure, high cholesterol, diabetes, heart disease, and obesity and by quitting smoking, limiting alcohol, and staying physically active. TREATMENT  Time is of the essence. Since the symptoms of TIA are the same as a stroke, it is important to seek treatment as soon as possible because you may need a medicine to dissolve the clot (thrombolytic) that cannot be given if too much time has passed. Treatment options vary. Treatment options may include rest, oxygen, intravenous (IV) fluids, and medicines to thin the blood (anticoagulants). Medicines and diet may be used to address diabetes, high blood pressure, and other risk factors. Measures will be taken to prevent short-term and long-term complications, including infection from breathing foreign material into the lungs (aspiration pneumonia), blood clots in the legs, and falls. Treatment options include procedures  to either remove plaque in the carotid arteries or dilate carotid arteries that have narrowed due to plaque. Those procedures are:  Carotid endarterectomy.  Carotid angioplasty and stenting. HOME CARE INSTRUCTIONS   Take all medicines prescribed  by your caregiver. Follow the directions carefully. Medicines may be used to control risk factors for a stroke. Be sure you understand all your medicine instructions.  You may be told to take aspirin or the anticoagulant warfarin. Warfarin needs to be taken exactly as instructed.  Taking too much or too little warfarin is dangerous. Too much warfarin increases the risk of bleeding. Too little warfarin continues to allow the risk for blood clots. While taking warfarin, you will need to have regular blood tests to measure your blood clotting time. A PT blood test measures how long it takes for blood to clot. Your PT is used to calculate another value called an INR. Your PT and INR help your caregiver to adjust your dose of warfarin. The dose can change for many reasons. It is critically important that you take warfarin exactly as prescribed.  Many foods, especially foods high in vitamin K can interfere with warfarin and affect the PT and INR. Foods high in vitamin K include spinach, kale, broccoli, cabbage, collard and turnip greens, brussels sprouts, peas, cauliflower, seaweed, and parsley as well as beef and pork liver, green tea, and soybean oil. You should eat a consistent amount of foods high in vitamin K. Avoid major changes in your diet, or notify your caregiver before changing your diet. Arrange a visit with a dietitian to answer your questions.  Many medicines can interfere with warfarin and affect the PT and INR. You must tell your caregiver about any and all medicines you take, this includes all vitamins and supplements. Be especially cautious with aspirin and anti-inflammatory medicines. Do not take or discontinue any prescribed or over-the-counter medicine except on the advice of your caregiver or pharmacist.  Warfarin can have side effects, such as excessive bruising or bleeding. You will need to hold pressure over cuts for longer than usual. Your caregiver or pharmacist will discuss other  potential side effects.  Avoid sports or activities that may cause injury or bleeding.  Be mindful when shaving, flossing your teeth, or handling sharp objects.  Alcohol can change the body's ability to handle warfarin. It is best to avoid alcoholic drinks or consume only very small amounts while taking warfarin. Notify your caregiver if you change your alcohol intake.  Notify your dentist or other caregivers before procedures.  Eat a diet that includes 5 or more servings of fruits and vegetables each day. This may reduce the risk of stroke. Certain diets may be prescribed to address high blood pressure, high cholesterol, diabetes, or obesity.  A low-sodium, low-saturated fat, low-trans fat, low-cholesterol diet is recommended to manage high blood pressure.  A low-saturated fat, low-trans fat, low-cholesterol, and high-fiber diet may control cholesterol levels.  A controlled-carbohydrate, controlled-sugar diet is recommended to manage diabetes.  A reduced-calorie, low-sodium, low-saturated fat, low-trans fat, low-cholesterol diet is recommended to manage obesity.  Maintain a healthy weight.  Stay physically active. It is recommended that you get at least 30 minutes of activity on most or all days.  Do not smoke.  Limit alcohol use even if you are not taking warfarin. Moderate alcohol use is considered to be:  No more than 2 drinks each day for men.  No more than 1 drink each day for nonpregnant women.  Stop drug abuse.  Home safety. A safe home environment is important to reduce the risk of falls. Your caregiver may arrange for specialists to evaluate your home. Having grab bars in the bedroom and bathroom is often important. Your caregiver may arrange for equipment to be used at home, such as raised toilets and a seat for the shower.  Follow all instructions for follow-up with your caregiver. This is very important. This includes any referrals and lab tests. Proper follow up can  prevent a stroke or another TIA from occurring. SEEK MEDICAL CARE IF:  You have personality changes.  You have difficulty swallowing.  You are seeing double.  You have dizziness.  You have a fever.  You have skin breakdown. SEEK IMMEDIATE MEDICAL CARE IF:  Any of these symptoms may represent a serious problem that is an emergency. Do not wait to see if the symptoms will go away. Get medical help right away. Call your local emergency services (911 in [place] Do not drive yourself to the hospital.  You have sudden weakness or numbness of the face, arm, or leg, especially on one side of the body.  You have sudden trouble walking or difficulty moving arms or legs.  You have sudden confusion.  You have trouble speaking (aphasia) or understanding.  You have sudden trouble seeing in one or both eyes.  You have a loss of balance or coordination.  You have a sudden, severe headache with no known cause.  You have new chest pain or an irregular heartbeat.  You have a partial or total loss of consciousness. MAKE SURE YOU:   Understand these instructions.  Will watch your condition.  Will get help right away if you are not doing well or get worse. Document Released: 01/15/2005 Document Revised: 04/12/2013 Document Reviewed: 07/13/2013 [facility] Patient Information 2015 [place], [place]. This information is not intended to replace advice given to you by your health care provider. Make sure you discuss any questions you have with your health care provider.  Information on my medicine - XARELTO (Rivaroxaban)  This medication education was reviewed with me or my healthcare representative as part of my discharge preparation.  The pharmacist that spoke with me during my hospital stay was:  Brain Hilts, [facility]  Why was Xarelto prescribed for you? Xarelto was prescribed for you to reduce the risk of a blood clot forming that can cause a stroke if you have a medical condition called  atrial fibrillation (a type of irregular heartbeat).  What do you need to know about xarelto ? Take your Xarelto ONCE DAILY at the same time every day with your evening meal. If you have difficulty swallowing the tablet whole, you may crush it and mix in applesauce just prior to taking your dose.  Take Xarelto exactly as prescribed by your doctor and DO NOT stop taking Xarelto without talking to the doctor who prescribed the medication.  Stopping without other stroke prevention medication to take the place of Xarelto may increase your risk of developing a clot that causes a stroke.  Refill your prescription before you run out.  After discharge, you should have regular check-up appointments with your healthcare provider that is prescribing your Xarelto.  In the future your dose may need to be changed if your kidney function or weight changes by a significant amount.  What do you do if you miss a dose? If you are taking Xarelto ONCE DAILY and you miss a dose, take it as soon as you remember on the same  day then continue your regularly scheduled once daily regimen the next day. Do not take two doses of Xarelto at the same time or on the same day.   Important Safety Information A possible side effect of Xarelto is bleeding. You should call your healthcare provider right away if you experience any of the following: ? Bleeding from an injury or your nose that does not stop. ? Unusual colored urine (red or dark brown) or unusual colored stools (red or black). ? Unusual bruising for unknown reasons. ? A serious fall or if you hit your head (even if there is no bleeding).  Some medicines may interact with Xarelto and might increase your risk of bleeding while on Xarelto. To help avoid this, consult your healthcare provider or pharmacist prior to using any new prescription or non-prescription medications, including herbals, vitamins, non-steroidal anti-inflammatory drugs (NSAIDs) and  supplements.  This website has more information on Xarelto: [email or web address]

## 2014-04-24 NOTE — Progress Notes (Signed)
STROKE TEAM PROGRESS NOTE   HISTORY Dolph Merriott is an 59 y.o. 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 Patient lives by himself and said that he has been having HA and " heart issues" for couple of days. He called ambulance complaining of severe HA and left sided weakness. When EMS arrived found him sitting in a chair, able to follow commands but having trouble expressing himself. NIHSS 9. CT brain showed no acute abnormality. Denies vertigo, double vision, difficulty swallowing, visual disturbances, or confusion. He was last known well: [birth year]/24[birth year], time uncertain. Patient was not administered TPA secondary to delay in arrival. He was admitted for further evaluation and treatment.   SUBJECTIVE (INTERVAL HISTORY) His PT is at the bedside. They just took a few steps. Overall he feels his condition is stable. The past 2 Sunday's he complained of HA and some blurry vision. He has hx of afib on Xarelto and then changed to coumadin due to [place] coverage. His INR difficulty to control. He stopped AC by himself.    OBJECTIVE Temp:  [98.1 F (36.7 C)-99.4 F (37.4 C)] 98.2 F (36.8 C) (01/04 0535) Pulse Rate:  [51-65] 51 (01/04 0535) Cardiac Rhythm:  [-] Normal sinus rhythm (01/04 0800) Resp:  [13-24] 20 (01/04 0535) BP: (131-150)/(52-71) 136/52 mmHg (01/04 0535) SpO2:  [93 %-100 %] 98 % (01/04 0535) Weight:  [156.718 kg (345 lb 8 oz)] 156.718 kg (345 lb 8 oz) (01/03 2339)   Recent Labs Lab 04/23/14 1832 04/23/14 2351 04/24/14 0705  GLUCAP 291* 205* 167*    Recent Labs Lab 04/23/14 1810 04/24/14 0628  NA 132* 137  K 4.2 4.0  CL 96 103  CO2 31 27  GLUCOSE 347* 186*  BUN 13 11  CREATININE 1.27 0.96  CALCIUM 8.5 8.4    Recent Labs Lab 04/23/14 1810 04/24/14 0628  AST 22 21  ALT 22 19   ALKPHOS 77 61  BILITOT 0.5 0.7  PROT 6.5 6.3  ALBUMIN 3.4* 3.1*    Recent Labs Lab 04/23/14 1810 04/23/14 2324 04/24/14 0628  WBC 34.4* 32.1* 31.9*  NEUTROABS 25.4* 24.7*  --   HGB 14.5 13.1 14.0  HCT 43.8 40.5 42.6  MCV 96.5 96.4 97.0  PLT 163 146* 156   No results for input(s): CKTOTAL, CKMB, CKMBINDEX, TROPONINI in the last 168 hours.  Recent Labs  04/23/14 1810  LABPROT 13.9  INR 1.06   No results for input(s): COLORURINE, LABSPEC, PHURINE, GLUCOSEU, HGBUR, BILIRUBINUR, KETONESUR, PROTEINUR, UROBILINOGEN, NITRITE, LEUKOCYTESUR in the last 72 hours.  Invalid input(s): APPERANCEUR     Component Value Date/Time   CHOL 132 04/24/2014 0628   TRIG 150* 04/24/2014 0628   HDL 29* 04/24/2014 0628   CHOLHDL 4.6 04/24/2014 0628   VLDL 30 04/24/2014 0628   LDLCALC 73 04/24/2014 0628   Lab Results  Component Value Date   HGBA1C 8.1* 04/23/2014      Component Value Date/Time   LABOPIA NONE DETECTED 04/24/2014 0025   COCAINSCRNUR NONE DETECTED 04/24/2014 0025   LABBENZ NONE DETECTED 04/24/2014 0025   AMPHETMU NONE DETECTED 04/24/2014 0025   THCU NONE DETECTED 04/24/2014 0025   LABBARB NONE DETECTED 04/24/2014 0025    No results for input(s): ETH in the last 168 hours.  I have personally reviewed the radiological images below and agree with the radiology interpretations.  Ct Head Wo Contrast 04/23/2014    1. No acute intracranial abnormality. 2. Stable mild chronic microvascular ischemic changes of the white matter. 3. Mild chronic right maxillary, right sphenoid and bilateral ethmoid sinusitis.   Mr Jodene Nam Head Wo Contrast 04/23/2014   1. No proximal branch occlusion or hemodynamically significant stenosis identified within the intracranial circulation. 2. 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 The right vertebral artery is widely patent.     Mr Brain Wo Contrast 04/23/2014   1. No acute intracranial  infarct or other abnormality. 2. Remote left cerebellar infarct. 3. Mild chronic small vessel ischemic disease. 4. Mild right maxillary and ethmoidal sinus disease.    CUS - Bilaterally 1-39% ICA stenosis, although the left ICA is borderline >40%. Antegrade right vertebral. 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  2D ehco - 02/16/2014 shows grade 2 diastolic dysfunction with an EF of 55%.  PHYSICAL EXAM  Temp:  [98.1 F (36.7 C)-99.3 F (37.4 C)] 98.5 F (36.9 C) (01/04 0930) Pulse Rate:  [51-65] 55 (01/04 0930) Resp:  [13-24] 20 (01/04 0930) BP: (131-150)/(52-65) 150/65 mmHg (01/04 0930) SpO2:  [95 %-100 %] 97 % (01/04 0930) Weight:  [345 lb 8 oz (156.718 kg)] 345 lb 8 oz (156.718 kg) (01/03 2339)  General - Well nourished, well developed, in no apparent distress.  Ophthalmologic - Sharp disc margins OU.  Cardiovascular - Regular rate and rhythm with no murmur.  Mental Status -  Level of arousal and orientation to time, place, and person were intact. Language including expression, naming, repetition, comprehension was assessed and found intact. Fund of Knowledge was assessed and was intact.  Cranial Nerves II - XII - II - Visual field intact OU. III, IV, VI - Extraocular movements intact. V - Facial sensation intact bilaterally. VII - Facial movement intact bilaterally. VIII - Hearing & vestibular intact bilaterally. X - Palate elevates symmetrically. XI - Chin turning & shoulder shrug intact bilaterally. XII - Tongue protrusion intact.  Motor Strength - The patient's strength was normal in RUE and RLE and pronator drift was absent, however, LUE and LLE give-away weakness.  Bulk was normal and fasciculations were absent.   Motor Tone - Muscle tone was assessed at the neck and appendages and was normal.  Reflexes - The patient's reflexes were normal in all extremities and he had no pathological reflexes.  Sensory - Light touch, temperature/pinprick, vibration and  proprioception, and Romberg testing were assessed and were normal    Coordination - The patient had normal movements in the hands and feet with no ataxia or dysmetria.  Tremor was absent.  Gait and Station -  Deferred due to safety.   ASSESSMENT/PLAN Mr. Darrelle Wiberg is a 59 y.o. male with history of hypertension, dyslipidemia, diabetes mellitus, CVA, peripheral vascular disease, a flutter and A. 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 He did not receive IV t-PA due to delay in arrival.   TIA vs recrudescence of prior right brain stroke due to headache   MRI  No acute infarct  MRA  Occluded L VA (chronic), o/w Unremarkable   Carotid Doppler  Right ICA unremarkable, left ICA 40% borderline.    2D Echo 02/16/2014 shows grade 2 diastolic dysfunction with an EF of 55%.  xarelto for VTE prophylaxis  Diet NPO time specified Except for: [employer], Sips with Meds . Ok to feed from stroke standpoint  no anticoagulation prior to admission (see afib), now on xarelto ( rivaroxaban)  Ongoing aggressive stroke risk factor management  Therapy recommendations:  pending   Disposition:  pending  Atrial Flutter/Atrial Fibrillation  Home meds:  Initially started on xarelto, changed to coumadin at the [place] as [place] will not cover xarelto. INR 1.06 on arrival.   Was on coumadin from Dec 8 to Dec 24. Stopped taking Dec 24 because he did not want to go for follow up labs   Changed to on xarleto. Concerned with coverage.  Recommend social worker help on medication   Hypertension  Stable  Hyperlipidemia  Home meds:  lipitor 10, resumed in hospital  LDL 73, goal < 70  Continue statin at discharge  Diabetes  HgbA1c 8.1 goal < 7.0  Uncontrolled  DM education  SSI  Other Stroke Risk Factors  Former Cigarette  smoker, stopped 13 yrs ago  Morbid Obesity, Body mass index is 45.59 kg/(m^2).   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 Hematology consult pending.  Chronic diastolic heart failure on lasix   Hospital day # [facility] for Pager information 04/24/2014 8:59 AM   I, the attending vascular neurologist, have personally obtained a history, examined the patient, evaluated laboratory data, individually viewed imaging studies and agree with radiology interpretations. Together with the NP/PA, we formulated the assessment and plan of care which reflects our mutual decision.  I have made any additions or clarifications directly to the above note and agree with the findings and plan as currently documented.   59 yo M with risk factor of HTN, HLD, DM and afib not on AC presented with HA, left sided weakness. MRI negative. Due to risk factors and afib not on AC, TIA should be considered. However, he had HA on presentation and stroke recrudescnece is also on DDx. He should be on [facility], ideally with NOAC as fluctuation of INR on coumadin. However, due to [place] coverage, not sure how compliant he is with medications.  Rosalin Hawking, MD PhD Stroke Neurology 04/24/2014 10:58 PM   To contact Stroke Continuity provider, please refer to [email or web address] After hours, contact General Neurology

## 2014-04-24 NOTE — Evaluation (Signed)
Clinical/Bedside Swallow Evaluation Patient Details  Name: Jeffrey Hancock MRN: [id number] Date of Birth: February 17, 1956  Today's Date: 04/24/2014 Time: 0928-1006 SLP Time Calculation (min) (ACUTE ONLY): 38 min  Past Medical History:  Past Medical History  Diagnosis Date  . Hypertension   . Hyperlipidemia   . Diabetes mellitus without complication   . CVA (cerebral infarction)   . PVD (peripheral vascular disease)   . Atrial flutter, paroxysmal     a. s/p failed RF ablation and placed on flecainide   . OSA (obstructive sleep apnea)   . Acute leukemia   . Coronary artery disease   . GERD (gastroesophageal reflux disease)   . H/O hiatal hernia   . Obesity    Past Surgical History:  Past Surgical History  Procedure Laterality Date  . Gastric bypass    . Cardiac catheterization  07/2013  . Ablation of dysrhythmic focus  02/16/2014    ATRIAL FLUTTER       DR Caryl Comes   . Nasal sinus surgery    . Elbow surgery Right 1998  . Atrial flutter ablation N/A 02/16/2014    Procedure: ATRIAL FLUTTER ABLATION;  Surgeon: Deboraha Sprang, MD;  Location: [facility], [place] CATH LAB;  Service: Cardiovascular;  Laterality: N/A;   HPI:  59 yo male adm to [facility] with left sided weakness.  Pt with PMH + for gastric bypass *pt reports it's a vertical sleeve, Afib s/p ablation, GERD, h/o smoking, sleep apnea s/p uvulectomy with Cpap use.  Remote cerebellar infarct noted on imaging studies 04/23/2014.     Assessment / Plan / Recommendation Clinical Impression  Pt demonstrates clinical indications of possible laryngeal penetration characterized by consistent throat clearing with intake.  He also reports sensation of solids lodging in pharynx requiring liquid consumption to clear.  Per pt, his surgeon recommended pt did not consume liquids with solids due to concern for swelling of gastric sleeve.  Attempts at dry swallows did not clear sensation of residuals and therefore pt took small bolus of liquid.  Advised pt to consider tsp amounts  of liquids only if needed.  Pt recently underwent MBS and was provided with compensation strategies/techniques for mitigation.  CN exam unremarkable and pt reports swallow function is at its baseline.    Recommend regular/thin diet with continued compensation strategies.  Provided pt with verbal and written compensation strategies for continued mitigation.      Aspiration Risk  Mild    Diet Recommendation Regular;Thin liquid   Liquid Administration via: Cup;Straw Medication Administration: Whole meds with liquid Supervision: Patient able to self feed Compensations: Slow rate;Small sips/bites Postural Changes and/or Swallow Maneuvers: Seated upright 90 degrees;Upright 30-60 min after meal    Other  Recommendations Oral Care Recommendations: Oral care BID   Follow Up Recommendations  None  Pt reports he is to follow up with SLP at [facility]   Frequency and Duration   n/a     Pertinent Vitals/Pain Afebrile, decreased     Swallow Study Prior Functional Status  Type of Home: House Available Help at Discharge: Family;Available PRN/intermittently;Personal care attendant (aide 2 hrs/day; 7 days/week)    General Date of Onset: 04/24/14 HPI: 59 yo male adm to [facility] with left sided weakness.  Pt with PMH + for gastric bypass *pt reports it's a vertical sleeve, Afib s/p ablation, GERD, h/o smoking, sleep apnea s/p uvulectomy with Cpap use.  Remote cerebellar infarct noted on imaging studies 04/23/2014.   Type of Study: Bedside swallow evaluation Previous Swallow Assessment: pt reports he  underwent an MBS at [facility] and was told his "flapper" did not flip adequately Diet Prior to this Study: NPO Temperature Spikes Noted: No Respiratory Status: Room air History of Recent Intubation: No Behavior/Cognition: Alert;Cooperative;Pleasant mood Oral Cavity - Dentition: Adequate natural dentition Self-Feeding Abilities: Able to feed self Patient Positioning: Upright in chair Baseline Vocal Quality: Clear (pt  reports worsening hoarseness in the last few weeks for which he is to see ENT) Volitional Cough: Strong Volitional Swallow: Able to elicit    Oral/Motor/Sensory Function Overall Oral Motor/Sensory Function: Appears within functional limits for tasks assessed   Ice Chips Ice chips: Not tested   Thin Liquid Thin Liquid: Impaired Presentation: Cup;Self Fed Pharyngeal  Phase Impairments: Throat Clearing - Immediate    Nectar Thick Nectar Thick Liquid: Not tested   Honey Thick Honey Thick Liquid: Not tested   Puree Puree: Within functional limits Presentation: Self Fed;Spoon   Solid   GO Functional Assessment Tool Used: BSE, clinical judgement Functional Limitations: Swallowing Swallow Current Status ([id number]): At least 20 percent but less than 40 percent impaired, limited or restricted Swallow Goal Status [phone]): At least 20 percent but less than 40 percent impaired, limited or restricted Swallow Discharge Status [phone]): At least 20 percent but less than 40 percent impaired, limited or restricted  Solid: Impaired Presentation: Self Fed Oral Phase Impairments: Impaired anterior to posterior transit;Impaired mastication;Reduced lingual movement/coordination Pharyngeal Phase Impairments: Suspected delayed Swallow;Throat Clearing - Immediate;Throat Clearing - Delayed Other Comments: c/o residuals in pharynx, requiring pt to consume liquids to clear       Luanna Salk, [place] [facility] SLP [phone]

## 2014-04-24 NOTE — Progress Notes (Signed)
Occupational Therapy Discharge Patient Details Name: Jeffrey Hancock MRN: [id number] DOB: 02/03/1956 Today's Date: 04/24/2014 Time:  -     Patient discharged from OT services secondary to no acute needs currently. discussed with PT lynn and Ot to sign off acutely..  Please see latest therapy progress note for current level of functioning and progress toward goals.    Progress and discharge plan discussed with patient and/or caregiver: Patient/Caregiver agrees with plan  GO     Peri Maris  Pager: [phone]  04/24/2014, 9:54 AM

## 2014-04-24 NOTE — Discharge Summary (Signed)
Physician Discharge Summary  Jeffrey Hancock SVX:[id number] DOB: 13-Jun-1955 DOA: 04/23/2014  PCP: Hilbert Corrigan, MD  Admit date: 04/23/2014 Discharge date: 04/24/2014  Time spent: 35 minutes  Recommendations for Outpatient Follow-up:  Patient will be discharged home.  Patient has refused physical therapy and states he has physical therapy through the [place]. Patient is continues medications as prescribed. He should follow-up with primary care physician within one week of discharge. Patient should resume activity as tolerated. Patient should follow a heart healthy/carb modified diet.  Patient should also follow-up with his oncologist.  Discharge Diagnoses:  Left sided weakness/possible TIA Diabetes mellitus, type II Hypertension Atrial fibrillation/atrial flutter Chronic diastolic heart failure CML  Discharge Condition: Stable  Diet recommendation:  Heart healthy/carb modified  Filed Weights   04/23/14 2339  Weight: 156.718 kg (345 lb 8 oz)    History of present illness:  By Dr. Berle Mull on 04/23/2014 Jeffrey Hancock is a 59 y.o. male with Past medical history of hypertension, dyslipidemia, diabetes mellitus, history of CVA, peripheral vascular disease, a flutter and A. 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 The patient is presenting with complaints of left-sided numbness and weakness that has been ongoing since January 1. He denies any fall trauma or injury. He complains of headache and was having difficulty walking around and therefore he decided to come to the ER. Patient complains of some blurred vision and feels like he is seeing through a frosted glass. This also has been present since last to 3 days. He complains of some sore throat and mentions it is burning and makes him nauseated whenever he tries to eat or drink anything.  He denies any chest pain abdominal  pain nausea vomiting diarrhea or burning urination. He was initially on Xarelto and at the [place] he was switched from Xarelto to Coumadin and since he did not wanted to go for an INR checkup at [place] he stopped taking Coumadin on December 24. Patient had a failed catheter ablation of a flutter in October 15, and was supposed to be on flecainide which she's taking. Patient also was recently diagnosed in September 15 at [facility] with CML, and was supposed to be on imatinib after a bone marrow biopsy to get a baseline, but unfortunately because of him being on anticoagulation biopsy was unable to be performed. Thus he is not taking any chemotherapy at present. He mentions is compliant with all other medications as well as a C Pap. The patient is coming from home. And at his baseline independent for most of his ADL.  Hospital Course:  Left-sided weakness/ possible TIA -Patient has had residual left-sided weakness from previous CVA -CT and MRI were negative for acute abnormalities -Neurology was consulted and appreciated -Carotid Doppler: Bilaterally 1-39% ICA stenosis, although the left ICA is borderline > 40%. 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 -Physical therapy was consulted and recommended outpatient physical therapy -Patient states that he sees physical therapy at the [facility]. -neurology recommended patient continue Xarelto  Diabetes mellitus, type II  -Continue home regimen  Hypertension -Pressure currently stable, continue home regimen  Atrial fibrillation/atrial flutter -Continue flecainide -Currently in sinus rhythm -Patient is status post ablation -Patient originally placed on Xarelto however this was discontinued by the [place], patient was placed on Coumadin however he did not like taking  the Coumadin. -Case management was consulted, patient was given Xarelto card as well as information  regarding assistance for receiving Xarelto  Chronic diastolic heart failure -Continue home regimen -Patient should continue to monitor his daily weights and intake and output -Appears to be euvolemic at this time  CML -Patient is to follow up with his hematologist/oncologist as an outpatient -Patient did state he did not want chemotherapy -Leukocytosis is likely secondary to [facility]  Procedures: Carotid doppler  Consultations: Neurology Oncology, via phone by admitting physician  Discharge Exam: Filed Vitals:   04/24/14 0930  BP: 150/65  Pulse: 55  Temp: 98.5 F (36.9 C)  Resp: 20     General: Well developed, well nourished, NAD, appears stated age  HEENT: NCAT, PERRLA, EOMI, Anicteic Sclera, mucous membranes moist.  Neck: Supple, no JVD, no masses  Cardiovascular: S1 S2 auscultated, no rubs, murmurs or gallops. Regular rate and rhythm.  Respiratory: Clear to auscultation bilaterally with equal chest rise  Abdomen: Soft, nontender, nondistended, + bowel sounds  Extremities: warm dry without cyanosis clubbing or edema  Neuro: AAOx3, cranial nerves grossly intact. Strength 5/5 in patient's upper and lower extremities bilaterally  Skin: Without rashes exudates or nodules  Psych: Normal affect and demeanor with intact judgement and insight  Discharge Instructions      Discharge Instructions    Discharge instructions    Complete by:  As directed   Patient will be discharged home.  Patient has refused physical therapy and states he has physical therapy through the [place]. Patient is continues medications as prescribed. He should follow-up with primary care physician within one week of discharge. Patient should resume activity as tolerated. Patient should follow a heart healthy/carb modified diet.  Patient should also follow-up with his oncologist.            Medication List    STOP taking these medications        furosemide 80 MG tablet  Commonly known as:  LASIX        TAKE these medications        amLODipine 10 MG tablet  Commonly known as:  NORVASC  Take 10 mg by mouth daily.     atorvastatin 10 MG tablet  Commonly known as:  LIPITOR  Take 10 mg by mouth at bedtime.     CALCIUM PO  Take 1 tablet by mouth 2 (two) times daily.     carvedilol 12.5 MG tablet  Commonly known as:  COREG  Take 12.5 mg by mouth 2 (two) times daily with a meal.     docusate sodium 100 MG capsule  Commonly known as:  COLACE  Take 400 mg by mouth at bedtime.     DULoxetine 20 MG capsule  Commonly known as:  CYMBALTA  Take 40 mg by mouth daily.     flecainide 100 MG tablet  Commonly known as:  TAMBOCOR  Take 1 tablet (100 mg total) by mouth 2 (two) times daily.     hydrALAZINE 10 MG tablet  Commonly known as:  APRESOLINE  Take 2.5 tablets (25 mg total) by mouth 2 (two) times daily.     insulin aspart protamine- aspart (70-30) 100 UNIT/ML injection  Commonly known as:  NOVOLOG MIX 70/30  Inject 35-50 Units into the skin 2 (two) times daily. Based on sugar levels     insulin regular 100 units/mL injection  Commonly known as:  NOVOLIN R,HUMULIN R  Inject 8 Units into the skin daily before breakfast.  IRON PO  Take 1 tablet by mouth 4 (four) times a week. Sunday, Tuesday, Thursday, Saturday     lisinopril 40 MG tablet  Commonly known as:  PRINIVIL,ZESTRIL  Take 40 mg by mouth daily.     oxyCODONE 5 MG immediate release tablet  Commonly known as:  Oxy IR/ROXICODONE  Take 10 mg by mouth 2 (two) times daily. scheduled     potassium chloride SA 20 MEQ tablet  Commonly known as:  K-DUR,KLOR-CON  Take 40 mEq by mouth daily.     rivaroxaban 20 MG Tabs tablet  Commonly known as:  XARELTO  Take 1 tablet (20 mg total) by mouth daily with supper.     torsemide 20 MG tablet  Commonly known as:  DEMADEX  Take 40 mg by mouth daily.     traZODone 100 MG tablet  Commonly known as:  DESYREL  Take 100 mg by mouth at bedtime.     VITAMIN B-12 PO  Take  1 tablet by mouth 3 (three) times a week. Monday, Wednesday, Friday       Allergies  Allergen Reactions  . Codeine Swelling  . Isordil [Isosorbide] Hives  . Nifedipine Other (See Comments)    Headache    Follow-up Information    Follow up with REID,INDIA, MD. Schedule an appointment as soon as possible for a visit in 1 week.   Specialty:  Internal Medicine   Why:  Hospital followup   Contact information:   [address] [place] [address] [phone]        The results of significant diagnostics from this hospitalization (including imaging, microbiology, ancillary and laboratory) are listed below for reference.    Significant Diagnostic Studies: Ct Head Wo Contrast  04/23/2014   CLINICAL DATA:  Ataxia.  Code stroke.  EXAM: CT HEAD WITHOUT CONTRAST  TECHNIQUE: Contiguous axial images were obtained from the base of the skull through the vertex without intravenous contrast.  COMPARISON:  CT head 11/04/2013.  FINDINGS: Asymmetry in the lateral ventricles is unchanged and felt to be developmental in origin. No mass lesion. No midline shift. No acute hemorrhage or hematoma. No extra-axial fluid collections. No evidence of acute infarction. Mild changes of small vessel disease of the white matter diffusely, unchanged. No focal brain parenchymal abnormality. No significant interval change.  No skull fracture or other focal osseous abnormality involving the skull. Prior bilateral maxillary sinus medial antrectomies with mucosal thickening in the right maxillary sinus. Mucosal thickening involving multiple bilateral ethmoid air cells. Mucous retention cyst or polyp in the right sphenoid sinus. Bilateral mastoid air cells and middle ear cavities well aerated. Bilateral carotid siphon and left vertebral artery atherosclerosis.  IMPRESSION: 1. No acute intracranial abnormality. 2. Stable mild chronic microvascular ischemic changes of the white matter. 3. Mild chronic right maxillary, right sphenoid and  bilateral ethmoid sinusitis. These results were called by telephone at the time of interpretation on 04/23/2014 at 6:29 pm to Dr. Carmin Muskrat, who verbally acknowledged these results.   Electronically Signed   By: Evangeline Dakin M.D.   On: 04/23/2014 18:30   Mr Jodene Nam Head Wo Contrast  04/23/2014   CLINICAL DATA:  Initial evaluation for acute onset severe headache with left-sided weakness.  EXAM: MRI HEAD WITHOUT CONTRAST  MRA HEAD WITHOUT CONTRAST  TECHNIQUE: Multiplanar, multiecho pulse sequences of the brain and surrounding structures were obtained without intravenous contrast. Angiographic images of the head were obtained using MRA technique without contrast.  COMPARISON:  Prior CT from  earlier the same day.  FINDINGS: MRI HEAD FINDINGS  The CSF containing spaces are within normal limits for patient age. Patchy T2/FLAIR hyperintensity within the periventricular deep white matter both cerebral hemispheres most consistent with chronic small vessel ischemic disease, mild for patient age.  No mass lesion, midline shift, or extra-axial fluid collection. Ventricles are normal in size without evidence of hydrocephalus.  No diffusion-weighted signal abnormality is identified to suggest acute intracranial infarct. Gray-white matter differentiation is maintained. Normal flow voids are seen within the intracranial vasculature. No intracranial hemorrhage identified. Remote left cerebellar infarct present.  The cervicomedullary junction is normal. Pituitary gland is within normal limits. Pituitary stalk is midline. The globes and optic nerves demonstrate a normal appearance with normal signal intensity.  The bone marrow signal intensity is normal. Calvarium is intact. Visualized upper cervical spine is within normal limits.  Scalp soft tissues are unremarkable.  Small retention cyst noted within the right sphenoid sinus. Scattered mucoperiosteal thickening present within the ethmoidal air cells and right maxillary sinus.  No air-fluid levels. Scattered fluid density present within the mastoid air cells bilaterally.  MRA HEAD FINDINGS  ANTERIOR CIRCULATION:  Visualized distal cervical segments of the internal carotid arteries are widely patent with antegrade flow. The petrous, cavernous, and supra clinoid segments are widely patent bilaterally without significant stenosis or atheromatous disease. A1 segments, anterior communicating artery, and anterior cerebral arteries well opacified.  M1 segments well opacified bilaterally without proximal branch occlusion or hemodynamically significant stenosis. There is question of a mild short-segment stenosis within the left M1 segment versus artifact (series 802, image 16). No aneurysm at the MCA bifurcations. Evaluation the distal MCA branches somewhat limited due to motion artifact, but are grossly normal in appearance and patent.  POSTERIOR CIRCULATION:  The left vertebral artery is likely occluded in the neck, similar to prior study. The right vertebral artery is widely patent to the level of the vertebrobasilar junction. The right posterior inferior cerebellar artery is widely patent the left posterior inferior cerebral artery not visualized. Vertebrobasilar junction and basilar artery within normal limits. Superior cerebellar artery is well opacified proximally. The posterior cerebral arteries are widely patent bilaterally. Irregularity within the distal PCA branches felt to be related to motion artifact.  No aneurysm or vascular malformation.  IMPRESSION: MRI HEAD IMPRESSION:  1. No acute intracranial infarct or other abnormality. 2. Remote left cerebellar infarct. 3. Mild chronic small vessel ischemic disease. 4. Mild right maxillary and ethmoidal sinus disease.  MRA HEAD IMPRESSION:  1. No proximal branch occlusion or hemodynamically significant stenosis identified within the intracranial circulation. 2. 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 The right vertebral artery is widely patent.   Electronically Signed   By: Jeannine Boga M.D.   On: 04/23/2014 21:50   Mr Brain Wo Contrast  04/23/2014   CLINICAL DATA:  Initial evaluation for acute onset severe headache with left-sided weakness.  EXAM: MRI HEAD WITHOUT CONTRAST  MRA HEAD WITHOUT CONTRAST  TECHNIQUE: Multiplanar, multiecho pulse sequences of the brain and surrounding structures were obtained without intravenous contrast. Angiographic images of the head were obtained using MRA technique without contrast.  COMPARISON:  Prior CT from earlier the same day.  FINDINGS: MRI HEAD FINDINGS  The CSF containing spaces are within normal limits for patient age. Patchy T2/FLAIR hyperintensity within the periventricular deep white matter both cerebral hemispheres most consistent with chronic small vessel ischemic disease, mild for patient age.  No mass lesion, midline shift, or extra-axial fluid collection. Ventricles are normal in size without evidence of hydrocephalus.  No diffusion-weighted signal abnormality is identified to suggest acute intracranial infarct. Gray-white matter differentiation is maintained. Normal flow voids are seen within the intracranial vasculature. No intracranial hemorrhage identified. Remote left cerebellar infarct present.  The cervicomedullary junction is normal. Pituitary gland is within normal limits. Pituitary stalk is midline. The globes and optic nerves demonstrate a normal appearance with normal signal intensity.  The bone marrow signal intensity is normal. Calvarium is intact. Visualized upper cervical spine is within normal limits.  Scalp soft tissues are unremarkable.  Small retention cyst noted within the right sphenoid sinus. Scattered mucoperiosteal thickening present within the ethmoidal air cells and right maxillary sinus. No air-fluid levels. Scattered fluid density present within the mastoid air cells bilaterally.  MRA HEAD FINDINGS   ANTERIOR CIRCULATION:  Visualized distal cervical segments of the internal carotid arteries are widely patent with antegrade flow. The petrous, cavernous, and supra clinoid segments are widely patent bilaterally without significant stenosis or atheromatous disease. A1 segments, anterior communicating artery, and anterior cerebral arteries well opacified.  M1 segments well opacified bilaterally without proximal branch occlusion or hemodynamically significant stenosis. There is question of a mild short-segment stenosis within the left M1 segment versus artifact (series 802, image 16). No aneurysm at the MCA bifurcations. Evaluation the distal MCA branches somewhat limited due to motion artifact, but are grossly normal in appearance and patent.  POSTERIOR CIRCULATION:  The left vertebral artery is likely occluded in the neck, similar to prior study. The right vertebral artery is widely patent to the level of the vertebrobasilar junction. The right posterior inferior cerebellar artery is widely patent the left posterior inferior cerebral artery not visualized. Vertebrobasilar junction and basilar artery within normal limits. Superior cerebellar artery is well opacified proximally. The posterior cerebral arteries are widely patent bilaterally. Irregularity within the distal PCA branches felt to be related to motion artifact.  No aneurysm or vascular malformation.  IMPRESSION: MRI HEAD IMPRESSION:  1. No acute intracranial infarct or other abnormality. 2. Remote left cerebellar infarct. 3. Mild chronic small vessel ischemic disease. 4. Mild right maxillary and ethmoidal sinus disease.  MRA HEAD IMPRESSION:  1. No proximal branch occlusion or hemodynamically significant stenosis identified within the intracranial circulation. 2. 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 The right vertebral artery is widely patent.   Electronically Signed   By: Jeannine Boga M.D.   On: 04/23/2014 21:50    Microbiology: No results found for this or any previous visit (from the past 240 hour(s)).   Labs: Basic Metabolic Panel:  Recent Labs Lab 04/23/14 1810 04/24/14 0628  NA 132* 137  K 4.2 4.0  CL 96 103  CO2 31 27  GLUCOSE 347* 186*  BUN 13 11  CREATININE 1.27 0.96  CALCIUM 8.5 8.4   Liver Function Tests:  Recent Labs Lab 04/23/14 1810 04/24/14 0628  AST 22 21  ALT 22 19  ALKPHOS 77 61  BILITOT 0.5 0.7  PROT 6.5 6.3  ALBUMIN 3.4* 3.1*   No results for input(s): LIPASE, AMYLASE in the last 168 hours. No results for input(s): AMMONIA in the last 168 hours. CBC:  Recent Labs Lab 04/23/14 1810 04/23/14 2324 04/24/14 0628  WBC 34.4* 32.1* 31.9*  NEUTROABS 25.4* 24.7*  --   HGB 14.5 13.1 14.0  HCT 43.8 40.5 42.6  MCV 96.5  96.4 97.0  PLT 163 146* 156   Cardiac Enzymes: No results for input(s): CKTOTAL, CKMB, CKMBINDEX, TROPONINI in the last 168 hours. BNP: BNP (last 3 results) No results for input(s): PROBNP in the last 8760 hours. CBG:  Recent Labs Lab 04/23/14 1832 04/23/14 2351 04/24/14 0705 04/24/14 1109  GLUCAP 291* 205* 167* 199*       Signed:  Cristal Ford  [facility] 04/24/2014, 12:14 PM

## 2014-04-24 NOTE — Progress Notes (Signed)
Upon assessment patient states that he doesn't recall having a bedside swallow eval in the ED. Then later states that he was given some coke had some difficulty. Per documentation from the ED, a BSE was completed around 1800 and he passed. I did not rescreen because there is one documented and the patient is NPO per MD orders. Called Dr. Nicole Kindred for pain control. PRN given. Paged Respiratory also. Patient request CPAP per home use. Respiratory at the bedside assisting.

## 2014-04-24 NOTE — Progress Notes (Signed)
UR completed 

## 2014-04-24 NOTE — Progress Notes (Signed)
Talked to patient about DCP; patient goes to the [place] [facility] for medical care; April with the [facility] called to make aware of admission; Patient stated that he is on Xarelto and the the [place] will not approve it; Xarelto Coupon card given to patient for a 30 day free trail and a patient medication assistance application given to the patient to see if he will qualify for ongoing Vianne Bulls RN,BSN,MHA [phone]

## 2014-04-24 NOTE — Progress Notes (Signed)
Pt is being discharged home. Discharge instructions were given to patient and family 

## 2014-04-24 NOTE — Progress Notes (Signed)
Pt. Was placed on CPAP of 14cm H2O per home settings via nasal mask. Pt. Is tolerating CPAP well at this time without any complications.

## 2014-04-24 NOTE — Evaluation (Signed)
Physical Therapy Evaluation Patient Details Name: Jeffrey Hancock MRN: [id number] DOB: 24-Dec-1955 Today's Date: 04/24/2014   History of Present Illness  59 yo male admitted via EMS from [facility] with L side weakness. NIH =9 MRI(-) no acute infarct, remote L cerebellar infarct, occluded L vetebral artery (10/14/2005 found on MRI) PMH: HTN, HLD, DM, CVA, PVD, AFib, acute leukemia, GERD, CAD, OSA, Gastric bypass, morbid obesity, R elbow surg, atrial flutter ablation [facility]  Clinical Impression  Patient evaluated by Physical Therapy with no further acute PT needs identified. All education has been completed and the patient has no further questions. Pt reports he is at his baseline. See below for any follow-up Physial Therapy or equipment needs. PT is signing off. Thank you for this referral.     Follow Up Recommendations Outpatient PT (pt reports he goes to PT every 3 weeks thru [place] system)    Equipment Recommendations  None recommended by PT    Recommendations for Other Services       Precautions / Restrictions Precautions Precautions: Fall      Mobility  Bed Mobility Overal bed mobility: Modified Independent             General bed mobility comments: incr time  Transfers Overall transfer level: Independent Equipment used: None             General transfer comment: step pivot to chair  Ambulation/Gait Ambulation/Gait assistance: Supervision Ambulation Distance (Feet): 25 Feet Assistive device: None Gait Pattern/deviations: Step-through pattern;Decreased stride length;Wide base of support Gait velocity: decr   General Gait Details: slight tremor LLE noted; pt reports he often walks inside without his RW  Stairs            Wheelchair Mobility    Modified Rankin (Stroke Patients Only) Modified Rankin (Stroke Patients Only) Pre-Morbid Rankin Score: Moderate disability Modified Rankin: Moderate disability     Balance Overall balance assessment: Needs  assistance   Sitting balance-Leahy Scale: Normal Sitting balance - Comments: donned socks I'ly   Standing balance support: No upper extremity supported Standing balance-Leahy Scale: Fair Standing balance comment: limited balance assessment due to MD arrived to examine                             Pertinent Vitals/Pain Pain Assessment:  (chronic Left knee pain)    Home Living Family/patient expects to be discharged to:: Private residence Living Arrangements: Alone (at times son, grandkids visit) Available Help at Discharge: Family;Available PRN/intermittently;Personal care attendant (aide 2 hrs/day; 7 days/week) Type of Home: House Home Access: Other (comment);Level entry (lift from ground to deck)     Home Layout: One level Home Equipment: Walker - 4 wheels;Other (comment);Electric scooter;Grab bars - tub/shower (Lt AFO, roll in shower,)      Prior Function Level of Independence: Needs assistance   Gait / Transfers Assistance Needed: uses rollator in home and scooter for outside home; if raining, uses 4W RW to get to car, loads it in back of car and makes it into drivers seat; just got AFO last week (carbon fiber with shin support)  ADL's / Homemaking Assistance Needed: aide assists with bathing, dressing, laundry, cooking        Hand Dominance   Dominant Hand: Right    Extremity/Trunk Assessment   Upper Extremity Assessment: LUE deficits/detail;Defer to OT evaluation (reports LUE at baseline due to prior CVA)           Lower Extremity Assessment:  LLE deficits/detail   LLE Deficits / Details: knee flexion limited to 90 due to pain; hip flexion, knee extension, ankle DF 3+-4/5 (baseline per pt)  Cervical / Trunk Assessment: Normal  Communication   Communication: Other (comment) (low volume)  Cognition Arousal/Alertness: Awake/alert Behavior During Therapy: WFL for tasks assessed/performed Overall Cognitive Status: Within Functional Limits for tasks  assessed                      General Comments      Exercises        Assessment/Plan    PT Assessment All further PT needs can be met in the next venue of care  PT Diagnosis Abnormality of gait   PT Problem List Decreased strength;Decreased balance;Decreased mobility;Obesity  PT Treatment Interventions     PT Goals (Current goals can be found in the Care Plan section) Acute Rehab PT Goals PT Goal Formulation: All assessment and education complete, DC therapy    Frequency     Barriers to discharge        Co-evaluation               End of Session   Activity Tolerance: Patient tolerated treatment well Patient left: in chair;with call bell/phone within reach;with chair alarm set Nurse Communication: Mobility status    Functional Assessment Tool Used: clinical judgement Functional Limitation: Mobility: Walking and moving around Mobility: Walking and Moving Around Current Status (W4665): At least 1 percent but less than 20 percent impaired, limited or restricted Mobility: Walking and Moving Around Goal Status [phone]): At least 1 percent but less than 20 percent impaired, limited or restricted Mobility: Walking and Moving Around Discharge Status [phone]): At least 1 percent but less than 20 percent impaired, limited or restricted    Time: 0827-0923 PT Time Calculation (min) (ACUTE ONLY): 56 min   Charges:   PT Evaluation $Initial PT Evaluation Tier I: 1 Procedure PT Treatments $Gait Training: 8-22 mins $Self Care/Home Management: 8-22   PT G Codes:   PT G-Codes **NOT FOR INPATIENT CLASS** Functional Assessment Tool Used: clinical judgement Functional Limitation: Mobility: Walking and moving around Mobility: Walking and Moving Around Current Status (T9030): At least 1 percent but less than 20 percent impaired, limited or restricted Mobility: Walking and Moving Around Goal Status [phone]): At least 1 percent but less than 20 percent impaired, limited or  restricted Mobility: Walking and Moving Around Discharge Status [phone]): At least 1 percent but less than 20 percent impaired, limited or restricted    Everlene Cunning 04/24/2014, 9:36 AM Pager [phone]

## 2014-04-24 NOTE — Progress Notes (Signed)
Pt left the hospital without his discharge instruction paperwork, xarelto education form, xarelto assistance form and and xarelto 0 co pay form. He states he doesn't need all this paperwork

## 2014-04-25 NOTE — Progress Notes (Signed)
Pt was discharged on 04/24/2014 and left without his discharge instructions, xarelto information/payment assistance card.  Information mailed to the patient on 04/25/2014.

## 2014-05-30 ENCOUNTER — Encounter: Payer: Self-pay | Admitting: Internal Medicine

## 2014-05-30 ENCOUNTER — Ambulatory Visit (INDEPENDENT_AMBULATORY_CARE_PROVIDER_SITE_OTHER): Payer: Non-veteran care | Admitting: Internal Medicine

## 2014-05-30 ENCOUNTER — Telehealth: Payer: Self-pay

## 2014-05-30 VITALS — BP 138/78 | HR 57 | Ht 72.0 in | Wt 360.2 lb

## 2014-05-30 DIAGNOSIS — I499 Cardiac arrhythmia, unspecified: Secondary | ICD-10-CM

## 2014-05-30 NOTE — Telephone Encounter (Signed)
Pt called and states the tablets he has for Hydralazine if 25 mg. States [place] gave him this, due to they do not have 10 mg. Please call.

## 2014-05-30 NOTE — Patient Instructions (Signed)
Your physician recommends that you schedule a follow-up appointment in:  2-3 months with Dr. Rockey Situ   Your physician recommends that you continue on your current medications as directed. Please refer to the Current Medication list given to you today.

## 2014-05-30 NOTE — Progress Notes (Signed)
Electrophysiology Office Note   Date:  05/30/2014   ID:  Jeffrey Hancock, DOB [birth year], MRN [id number]  PCP:  No primary care provider on file.  Cardiologist:  TG Primary Electrophysiologist:  Virl Axe, MD    Chief Complaint  Patient presents with  . other    Patient @ [facility] due to possible TIA/stroke c/o irregular heart beat. Meds reviewed verbally with pt.     History of Present Illness: Jeffrey Hancock is a 59 y.o. male   Seen in follow-up for atrial flutter. This was initially diagnosed at [facility] in the [facility] spring 2015 and he underwent catheter ablation October 2878 that was complicated by atrial fibrillation.He had post procedure palpitations and flecainide was maintained. He is also been maintained on long-term anticoagulation with Rivaroxaban  this was not covered by the [place]. They prescribed Coumadin and he has not inclined to take it.  He has a history of a prior stroke hypertension and diabetes.  At his last visit he also had evidence of volume overload and we discussed the importance of decreasing fluid intake.  He has done a major effort to decrease his intake. Not withstanding this, he has put on about 15 pounds. This however is been concurrent with the introduction of immunotherapy for his leukemia one of the side effects of which is weight gain. He denies significant change in breathlessness. He has not had any chest pain.       Today, he denies symptoms of  chest pain, shortness of breath, orthopnea, PND, lower extremity edema, bleeding, or neurologic sequela.  he has  complaints of palpitations but no , lightheadedness presyncope or syncope  The patient is tolerating medications without difficulties and is otherwise without complaint today.    Past Medical History  Diagnosis Date  . Hypertension   . Hyperlipidemia   . Diabetes mellitus without complication   . CVA (cerebral infarction)   . PVD (peripheral vascular disease)   . Atrial flutter, paroxysmal     a. s/p  failed RF ablation and placed on flecainide   . OSA (obstructive sleep apnea)   . Acute leukemia   . Coronary artery disease   . GERD (gastroesophageal reflux disease)   . H/O hiatal hernia   . Obesity    Past Surgical History  Procedure Laterality Date  . Gastric bypass    . Cardiac catheterization  07/2013  . Ablation of dysrhythmic focus  02/16/2014    ATRIAL FLUTTER       DR Caryl Comes   . Nasal sinus surgery    . Elbow surgery Right 1998  . Atrial flutter ablation N/A 02/16/2014    Procedure: ATRIAL FLUTTER ABLATION;  Surgeon: Deboraha Sprang, MD;  Location: [facility] CATH LAB;  Service: Cardiovascular;  Laterality: N/A;     Current Outpatient Prescriptions  Medication Sig Dispense Refill  . amLODipine (NORVASC) 10 MG tablet Take 10 mg by mouth daily.     Marland Kitchen atorvastatin (LIPITOR) 10 MG tablet Take 10 mg by mouth at bedtime.     Marland Kitchen CALCIUM PO Take 1 tablet by mouth 2 (two) times daily.    . carvedilol (COREG) 12.5 MG tablet Take 12.5 mg by mouth 2 (two) times daily with a meal.    . Cyanocobalamin (VITAMIN B-12 PO) Take 1 tablet by mouth 3 (three) times a week. Monday, Wednesday, Friday    . docusate sodium (COLACE) 100 MG capsule Take 400 mg by mouth at bedtime.    . DULoxetine (CYMBALTA) 20  MG capsule Take 40 mg by mouth daily.    . flecainide (TAMBOCOR) 100 MG tablet Take 1 tablet (100 mg total) by mouth 2 (two) times daily. 180 tablet 3  . hydrALAZINE (APRESOLINE) 10 MG tablet Take 2.5 tablets (25 mg total) by mouth 2 (two) times daily. (Patient taking differently: Take 10 mg by mouth 2 (two) times daily. ) 60 tablet 11  . insulin aspart protamine- aspart (NOVOLOG MIX 70/30) (70-30) 100 UNIT/ML injection Inject 35-50 Units into the skin 2 (two) times daily. Based on sugar levels    . insulin regular (NOVOLIN R,HUMULIN R) 100 units/mL injection Inject 8 Units into the skin daily before breakfast.    . IRON PO Take 1 tablet by mouth 4 (four) times a week. Sunday, Tuesday, Thursday, Saturday     . lisinopril (PRINIVIL,ZESTRIL) 40 MG tablet Take 40 mg by mouth daily.    Marland Kitchen oxyCODONE (OXY IR/ROXICODONE) 5 MG immediate release tablet Take 10 mg by mouth 3 (three) times daily. scheduled    . potassium chloride SA (K-DUR,KLOR-CON) 20 MEQ tablet Take 40 mEq by mouth daily.     Marland Kitchen torsemide (DEMADEX) 20 MG tablet Take 40 mg by mouth daily.    . traZODone (DESYREL) 100 MG tablet Take 300 mg by mouth at bedtime.      No current facility-administered medications for this visit.    Allergies:   Codeine; Isordil; and Nifedipine   Social History:  The patient  reports that he quit smoking about 13 years ago. His smoking use included Cigarettes. He has a 10 pack-year smoking history. He has never used smokeless tobacco. He reports that he does not drink alcohol or use illicit drugs.   Family History:  The patient's family history is not on file.    ROS:  Please see the history of present illness.   Otherwise, review of systems is negative .    PHYSICAL EXAM: VS:  BP 138/78 mmHg  Pulse 57  Ht 6' (1.829 m)  Wt 360 lb 4 oz (163.408 kg)  BMI 48.85 kg/m2 , BMI Body mass index is 48.85 kg/(m^2). GEN: Well nourished, well developed, in no acute distress HEENT: normal Neck: JVD 8-9, carotid bruits, or masses Cardiac: R RR; 2/6 murmur  rubs, + S4  Respiratory:  clear to auscultation bilaterally, normal work of breathing Back without kyphosis or CVAT GI: soft, nontender, nondistended, + BS MS: no deformity or atrophy Skin: warm and dry,   Extremities No Clubbing cyanosis 2 Edema Neuro:  Strength and sensation are intact Psych: euthymic mood, full affect  EKG:  EKG is ordered today. The ekg ordered today shows sinus at 57 Intervals 19/13/43 Otherwise normal   Recent Labs: 04/24/2014: ALT 19; BUN 11; Creatinine 0.96; Hemoglobin 14.0; Platelets 156; Potassium 4.0; Sodium 137    Lipid Panel     Component Value Date/Time   CHOL 132 04/24/2014 0628   TRIG 150* 04/24/2014 0628   HDL 29*  04/24/2014 0628   CHOLHDL 4.6 04/24/2014 0628   VLDL 30 04/24/2014 0628   LDLCALC 73 04/24/2014 0628     Wt Readings from Last 3 Encounters:  05/30/14 360 lb 4 oz (163.408 kg)  04/23/14 345 lb 8 oz (156.718 kg)  03/07/14 341 lb 1.9 oz (154.731 kg)      Other studies Reviewed: Additional studies/ records that were reviewed today include: none  Review of the above records today demonstrates:    ASSESSMENT AND PLAN:  Atrial fibrillation/flutter  HFpEF  Leukemia  on immunotherapy  Sinus bradycardia  Preoperative evaluation  The patient had a significant amount of volume overload at this time. It may be ascribed to his immunotherapy. There apparently issues of renal dysfunction although our last blood work 1/16 demonstrates normal creatinine clearance. Blood work was drawn last week at [facility]. It will be important that the physicians at [facility] undertake more aggressive diuresis trying to balance the implications of his immunotherapy renal function and volume overload.  In addition, his bradycardia begs the question as to whether he has chronotropic incompetence contributing to his exercise intolerance. He will get a pulse ox meter to measure his heart rate with exertion. His carvedilol dose can be adjusted as necessary.  He is not taking anticoagulation as he is not interested in taking Coumadin and the [facility] was not interested in paying for his Rivaroxaban   We will also set him up to reestablish with Dr. Deidre Ala. I think his cardiovascular risks for surgery are acceptable in the context of his morbid obesity, although I do think diuresis preoperatively would be of some value (see above)   Current medicines are reviewed at length with the patient today.   The patient does   have concerns regarding his medicines  he is not taking anticoagulation for his atrial fibrillation Labs/ tests ordered today include:   Orders Placed This Encounter  Procedures  . EKG 12-Lead     Disposition:   FU  withTG 3 month(s)   Signed, Virl Axe, MD  05/30/2014 9:25 AM     [facility] [address] [place] [address] [phone] (office) [phone] (fax)

## 2014-05-30 NOTE — Telephone Encounter (Signed)
Patient called to have [place] update his medication list  He was previously taking 25 mg of hydralazine bid by taking 10 mg tablets (2.5 tablets)  [facility] 25 mg [facility]   Medication list updated as requested

## 2014-08-01 ENCOUNTER — Ambulatory Visit: Payer: Non-veteran care | Admitting: Cardiovascular Disease

## 2014-08-11 ENCOUNTER — Emergency Department
Admit: 2014-08-11 | Disposition: A | Discharge status: Other Acute Inpatient Hospital | Payer: Self-pay | Admitting: Emergency Medicine

## 2014-08-11 LAB — COMPREHENSIVE METABOLIC PANEL
AST: 20 U/L
Albumin: 3.3 g/dL — ABNORMAL LOW
Alkaline Phosphatase: 50 U/L
Anion Gap: 3 — ABNORMAL LOW (ref 7–16)
BUN: 35 mg/dL — ABNORMAL HIGH
Bilirubin,Total: 0.7 mg/dL
CO2: 27 mmol/L
Calcium, Total: 8 mg/dL — ABNORMAL LOW
Chloride: 107 mmol/L
Creatinine: 2.29 mg/dL — ABNORMAL HIGH
EGFR (African American): 36 — ABNORMAL LOW
EGFR (Non-African Amer.): 31 — ABNORMAL LOW
GLUCOSE: 107 mg/dL — AB
POTASSIUM: 5 mmol/L
SGPT (ALT): 17 U/L
SODIUM: 137 mmol/L
Total Protein: 6 g/dL — ABNORMAL LOW

## 2014-08-11 LAB — CBC
HCT: 35.1 % — ABNORMAL LOW (ref 40.0–52.0)
HGB: 11.6 g/dL — AB (ref 13.0–18.0)
MCH: 32.8 pg (ref 26.0–34.0)
MCHC: 33.1 g/dL (ref 32.0–36.0)
MCV: 99 fL (ref 80–100)
PLATELETS: 123 10*3/uL — AB (ref 150–440)
RBC: 3.54 10*6/uL — AB (ref 4.40–5.90)
RDW: 16.9 % — ABNORMAL HIGH (ref 11.5–14.5)
WBC: 9.6 10*3/uL (ref 3.8–10.6)

## 2014-08-11 LAB — LACTIC ACID, PLASMA: LACTIC ACID, VENOUS: 1.1 mmol/L

## 2014-08-11 LAB — TROPONIN I: Troponin-I: 0.03 ng/mL

## 2014-08-11 LAB — PRO B NATRIURETIC PEPTIDE: B-Type Natriuretic Peptide: 92 pg/mL

## 2014-08-11 LAB — CK TOTAL AND CKMB (NOT AT ARMC)
CK, TOTAL: 237 U/L
CK-MB: 5.7 ng/mL — ABNORMAL HIGH

## 2014-08-12 NOTE — H&P (Signed)
PATIENT NAME:  Jeffrey Hancock, Jeffrey Hancock MR#:  [id number] DATE OF BIRTH:  1955-09-06  DATE OF ADMISSION:  02/24/2014  ADMITTING DIAGNOSIS: Chest pain.   REFERRING PHYSICIAN: Valli Glance. Owens Shark, MD     PRIMARY CARE DOCTOR: [facility].   HISTORY OF PRESENT ILLNESS: This is a 59 year old Caucasian male who presents to the Emergency Department complaining of chest pain. The patient states that it began while he was mowing his yard and raking some leaves. The pain stopped once he stopped working and the patient ate supper but subsequently became nauseous. The pain returned and was approximately 8/10 in severity. The patient points to the pain as being pinpoint and sharp, it is medial and slightly superior to the left nipple. The patient states "that it goes all the way through to the back." He denies that it is associated with respirations. Currently the pain is 3/10 in severity and is not radiating. Notably, the patient underwent an ablation 1 week ago for atrial flutter. He states that sometime shortly after the ablation he began having palpitations again and currently admits to palpitations. Due to his persistent pain, which at this point has lasted approximately 7 hours, the Emergency Department called for admission.   REVIEW OF SYSTEMS:   CONSTITUTIONAL: The patient denies fever or weakness.  EYES: Denies inflammation or blurred vision.  EARS, NOSE AND THROAT: Denies tinnitus or sore throat.  RESPIRATIONS: Denies cough or shortness of breath.  CARDIOVASCULAR: Admits to palpitations and chest pain but denies dyspnea on exertion or orthopnea.  GASTROINTESTINAL: Admits to nausea but denies vomiting or abdominal pain. The patient incidentally admits to 1 episode of diarrhea early this morning.  ENDOCRINE: Denies polyuria or nocturia.  HEMATOLOGIC AND LYMPHATIC: Admits to easy bruising but denies bleeding.  INTEGUMENTARY: Denies rashes or lesions.  MUSCULOSKELETAL: Denies myalgias or arthralgias.   NEUROLOGIC: Denies numbness in his extremities or dysarthria. PSYCHIATRIC: Denies depression or suicidal ideation.  GENITOURINARY: Denies dysuria, increased frequency or hesitancy.   PAST MEDICAL HISTORY: Acute myelogenous leukemia, CVA with left side weakness, atrial fibrillation, atrial flutter, diabetes mellitus type 2, hypertension, and obesity.   SURGICAL HISTORY: A recent cardioablation, appendectomy, multiple sinus surgeries, palatoplasty, tonsillectomy and adenoidectomy, right elbow reconstruction x 3, gastric bypass (vertical sleeve procedure), carpal tunnel repair on the right wrist.   FAMILY HISTORY: His maternal grandmother is deceased of CHF and diabetes. His mother has had colon cancer and is still living.   SOCIAL HISTORY: The patient denies smoking, drinking or drugs. He has one son who is in good health.     MEDICATIONS:  1.  Oxycodone 2 capsules orally p.o. b.i.d. as needed for pain.  2.  Lisinopril 40 mg 1 tab p.o. daily.  3.  Xarelto 20 mg 1 tab p.o. daily.  4.  Duloxetine 40 mg delayed release 1 capsule p.o. daily.  5.  Trazodone 100 mg 1 tab p.o. daily.  6.  Atorvastatin 10 mg 1 tab p.o. at bedtime.  7.  Carvedilol 3.125 mg 1 tab p.o. b.i.d.  8.  Amlodipine 10 mg 1 tab p.o. daily.  9.  Furosemide 80 mg 1 tab p.o. daily.  10.  Potassium chloride 2 tabs p.o. daily.   ALLERGIES: CODEINE, ISOSORBIDE DINITRATE AND NIFEDIPINE.   PERTINENT LABORATORY RESULTS AND RADIOGRAPHIC FINDINGS: Serum glucose is 266, BUN 12, creatinine 1.16, sodium 140, potassium is 4, chloride 106, bicarb 29, calcium 8.3. Troponin 0.05 x 2 sets. White blood cell count 25.6, hemoglobin is 12.8, hematocrit 39, platelets 162, MCV  is 100. INR is 2.  Chest x-Karver shows cardiac enlargement with developing pulmonary vascular congestion since previous study.   PHYSICAL EXAMINATION:  VITAL SIGNS: Temperature is 98.5, pulse 57, respirations 22, blood pressure 124/68, pulse oximetry 94% on 2 liters of oxygen  via nasal cannula.  GENERAL: The patient is alert and oriented x 3 in no apparent distress.  HEENT: Normocephalic, atraumatic. Pupils equal, round, and reactive to light and accommodation. Extraocular movements are intact. Mucous membranes are moist.  NECK: Trachea is midline. No adenopathy.  CHEST: Symmetric and atraumatic.  CARDIOVASCULAR: Regular rate and rhythm. Normal S1, S2. No rubs, clicks, or murmurs appreciated.  LUNGS: Clear to auscultation bilaterally. Normal effort and excursion.  ABDOMEN: Positive bowel sounds. Soft, nontender, nondistended. No hepatosplenomegaly.  GENITOURINARY: Deferred.  MUSCULOSKELETAL: The patient moves all 4 extremities equally. There is 5/5 strength in upper and lower extremities on the right side but approximately 4+/5 strength in the upper and lower extremities on the left.  SKIN: No rashes or lesions but the patient does have some bruising from previous arterial line placement for his ablation procedure.  EXTREMITIES: No clubbing or cyanosis. The patient has 1+ pitting edema to the knees. He is wearing compression socks.  NEUROLOGIC: Cranial nerves II through XII are grossly intact.  PSYCHIATRIC: Mood is normal. Affect is congruent.   ASSESSMENT AND PLAN: This is a 59 year old male admitted for atypical chest pain.   1.  Chest pain is atypical in duration. His cardiac enzymes are negative so far and he has no EKG changes. No indication of aortic dissection as pain is pinpoint and non-radiating. In the Emergency Department he was started on a heparin drip due to his recent cardiac history. If he rules out for myocardial infaction, of course we will discontinue the therapeutic heparin dosing. We will continue to follow his biomarkers. A cardiology consult has been ordered. Cardiac catheterization earlier this year showed small lesions not amenable to intervention. 2.  Atrial fibrillation. The patient had an ablation 1 week ago. His current episode of pain does  not appear to be related to that procedure. Currently he has a sinus bradycardia but is stable. We will continue to monitor the patient on telemetry. Once he has ruled out, we will switch the patient back to Xarelto for thromboembolic prophylaxis.  3.  Hypertension. The patient's blood pressure is currently controlled well. He reports that new medicine has been added to his regimen, but he cannot recall the names of these medications. In the morning we will check with the pharmacy to complete his medication reconciliation.  4.  Diabetes type 2. Sliding scale insulin while the patient is in the hospital.  5.  Acute myelogenous leukemia. The patient is not currently in treatment. He has explained to me that he has had some difficulty getting his chemotherapy because his Coumadin is not approved with the use of certain chemotherapeutic agents. I have placed a discharge planning consult to help coordinate the logistics of his hematology/oncology care through the [facility] system, which notably denied him admission for this particular episode of pain.  6.  Obesity. The patient's BMI is 48. He is status post gastric bypass surgery and reports having lost approximately 120 pounds. I have encouraged portion control and eating a balanced diet.  7.  Deep vein thrombosis prophylaxis. The patient is currently on treatment, heparin, but we will resume Xarelto upon discharge.  8.  Gastrointestinal prophylaxis. The patient is on pantoprazole following gastric bypass surgery.  9.  The patient is a full code.   TIME SPENT ON ADMISSION ORDERS AND PATIENT CARE: Approximately 45 minutes.   ____________________________ Norva Riffle. Marcille Blanco, MD msd:AT D: 02/24/2014 03:59:05 ET T: 02/24/2014 05:47:04 ET JOB#: [id number]  cc: Norva Riffle. Marcille Blanco, MD, <Dictator> Norva Riffle Malania Gawthrop MD ELECTRONICALLY SIGNED 02/24/2014 7:08

## 2014-08-12 NOTE — Consult Note (Signed)
General Aspect Primary Cadiologist: Dr. Caryl Comes, MD _______________________  59 year old male with history of CAD, recently diagnosed paroxysmal atrial flutter in spring 2015, AML 5 weeks ago, nonsustained atrial tachycardia, prior CVA 2010, HTN, HLD, & IDDM who was admitted to [facility] 02/09/14 after presenting of our office to drop off a letter and was found to be quite SOB and fatigued. Since his admission he has complained of chest pain 6-10/10 with TnI negative x 5. He has remained in atrial flutter. We are consulted for further evaluation. ______________________  PMH: 1. CAD - lexiscan 07/2013, cath 08/2013 - small vessel disease at the [facility] 2. Recently diagnosed atrial flutter sping 2015 3. AML diagnosed 12/2013 (followed at [facility]) 4. Nonsustained atrial tachycardia 5. Prior CVA in 2010 6. HTN 7. HLD 8. IDDM _______________________   Present Illness 59 year old male with the above problem list who presents after presenting of our office to drop off a letter and was found to be quite SOB and fatigued. Since his admission he has complained of chest pain 6-10/10 with TnI negative x 5. He has remained in atrial flutter. Cardiology consulted for angina and atrial flutter.  Patient with history of CAD with recent [place] 07/2013 in April 2015 that lead to a cath in May 2015 that showed small vessel disease through the [facility]. He was recently diagnosed with paroxysmal atrial flutter in the spring of 2015. EKGs and recorders have shown nonsustained atrial tachycardia and frequent PACs. He is currently on warfarin for stroke prevention. 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   He was recently diagnosed with AML, followed by [facility]. He will be on Imatinib. This has been delayed 2/2 his anticoagulation. There are plans for him to undergo ablation for his atrial flutter. His valve  status is unknown at this time for possible NOAC - he does not have a good appetite.   He presented to our office to drop off a letter and was found to be quite SOB and fatigued. Since his admission he has complained of chest pain 6-10/10 with TnI negative x 5. He has remained in atrial flutter. Prior to arriving at our office he had been at the grocery store and developed some SOB and chest pain (which he denied at our office). He complains of substernal chest pain for the past 3-4 days, non radiating. Some associated SOB, nausea (at baseline), diaphoresis, and presyncope. He also notes a syncopal episode the week prior while mowing the lawn - EMS transported him to outside hospital. Since his arrival he has remained in atrial flutter, chest pain has remained a constant 6/10. He did have a short run of NSVT a brief while ago. K+ 3.9 - 4.1, WBC 20 - 18 - 15, INR 3.2 - 2.7.   Physical Exam:  GEN well developed, well nourished, no acute distress, obese   HEENT hearing intact to voice, moist oral mucosa   NECK supple   RESP normal resp effort  clear BS   CARD Irregular rate and rhythm  Normal, S1, S2  No murmur   ABD denies tenderness  soft  normal BS   EXTR trace bilateral no-pitting edema to the mid shin   SKIN normal to palpation   NEURO cranial nerves intact   PSYCH alert, A+O to time, place, person, good insight   Review of Systems:  Subjective/Chief Complaint Chest  pain, palpitations   General: Fatigue  Weakness   Skin: No Complaints   ENT: No Complaints   Eyes: No Complaints   Neck: No Complaints   Respiratory: Short of breath   Cardiovascular: Chest pain or discomfort  Tightness  Palpitations  Dyspnea  Orthopnea  Edema   Gastrointestinal: Nausea   Genitourinary: No Complaints   Vascular: No Complaints   Musculoskeletal: No Complaints   Neurologic: Dizzness  Fainting   Hematologic: No Complaints   Endocrine: No Complaints   Psychiatric: No Complaints    Review of Systems: All other systems were reviewed and found to be negative   Medications/Allergies Reviewed Medications/Allergies reviewed   Family & Social History:  Family and Social History:  Family History Coronary Artery Disease   Social History negative tobacco, negative ETOH, negative Illicit drugs   Place of Living Home     leukemia:    Multiple Sclerosis:    Dm:    CVA:    HTN:    gastric bypass:          Admit Diagnosis:   UNSTABLE ANGINA: Onset Date: 10-Feb-2014, Status: Active, Description: UNSTABLE ANGINA  Home Medications: Medication Instructions Status  amLODIPine 10 mg oral tablet 1 tab(s) orally once a day Active  aspirin 81 mg oral tablet 1 tab(s) orally once a day Active  atenolol 50 mg oral tablet 1 tab(s) orally once a day Active  atorvastatin 10 mg oral tablet 1 tab(s) orally once a day (at bedtime) Active  DULoxetine 40 mg oral delayed release capsule 1 cap(s) orally once a day Active  furosemide 80 mg oral tablet 1 tab(s) orally once a day Active  lisinopril 40 mg oral tablet 1 tab(s) orally once a day Active  oxyCODONE 5 mg oral capsule 2 cap(s) orally 2 times a day Active  traZODone 100 mg oral tablet 1 tab(s) orally once a day (at bedtime) Active  warfarin 5 mg oral tablet 1 tab(s) orally once a day (in the morning) on Tuesday, Thursday, Saturday, and Sunday Active  warfarin 5 mg oral tablet 1.5 tab(s) orally once a day (in the morning) on Monday, Wednesday, and Friday Active  potassium chloride 2 tab(s) orally once a day Active   Lab Results:  Hepatic:  22-Oct-15 15:42   Bilirubin, Total 0.5  Alkaline Phosphatase 71 (46-116 NOTE: New Reference Range 11/08/13)  SGPT (ALT) 29 (14-63 NOTE: New Reference Range 11/08/13)  SGOT (AST) 28  Total Protein, Serum 7.2  Albumin, Serum  3.3  Routine Chem:  22-Oct-15 15:42   Result Comment HEMOGRAM/PLATELET - SMEAR SCANNED  Result(s) reported on 09 Feb 2014 at 04:02PM.  Glucose, Serum  174   BUN 10  Creatinine (comp) 1.10  Sodium, Serum 138  Potassium, Serum 3.9  Chloride, Serum 100  CO2, Serum 31  Calcium (Total), Serum  8.3  Osmolality (calc) 279  eGFR (African American) >60  eGFR (Non-African American) >60 (eGFR values <36mL/min/1.73 m2 may be an indication of chronic kidney disease (CKD). Calculated eGFR, using the MRDR Study equation, is useful in  patients with stable renal function. The eGFR calculation will not be reliable in acutely ill patients when serum creatinine is changing rapidly. It is not useful in patients on dialysis. The eGFR calculation may not be applicable to patients at the low and high extremes of body sizes, pregnant women, and vetetarians.)  Anion Gap 7  Cardiac:  22-Oct-15 15:42   Troponin I < 0.02 (0.00-0.05 0.05 ng/mL or less: NEGATIVE  Repeat testing  in 3-6 hrs  if clinically indicated. >0.05 ng/mL: POTENTIAL  MYOCARDIAL INJURY. Repeat  testing in 3-6 hrs if  clinically indicated. NOTE: An increase or decrease  of 30% or more on serial  testing suggests a  clinically important change)    19:44   Troponin I < 0.02 (0.00-0.05 0.05 ng/mL or less: NEGATIVE  Repeat testing in 3-6 hrs  if clinically indicated. >0.05 ng/mL: POTENTIAL  MYOCARDIAL INJURY. Repeat  testing in 3-6 hrs if  clinically indicated. NOTE: An increase or decrease  of 30% or more on serial  testing suggests a  clinically important change)    23:01   Troponin I < 0.02 (0.00-0.05 0.05 ng/mL or less: NEGATIVE  Repeat testing in 3-6 hrs  if clinically indicated. >0.05 ng/mL: POTENTIAL  MYOCARDIAL INJURY. Repeat  testing in 3-6 hrs if  clinically indicated. NOTE: An increase or decrease  of 30% or more on serial  testing suggests a  clinically important change)  Routine Coag:  22-Oct-15 15:42   Activated PTT (APTT)  45.7 (A HCT value >55% may artifactually increase the APTT. In one study, the increase was an average of 19%. Reference: "Effect on  Routine and Special Coagulation Testing Values of Citrate Anticoagulant Adjustment in Patients with High HCT Values." American Journal of Clinical Pathology 2006;126:400-405.)  Prothrombin  27.6  INR 2.7 (INR reference interval applies to patients on anticoagulant therapy. A single INR therapeutic range for coumarins is not optimal for all indications; however, the suggested range for most indications is 2.0 - 3.0. Exceptions to the INR Reference Range may include: Prosthetic heart valves, acute myocardial infarction, prevention of myocardial infarction, and combinations of aspirin and anticoagulant. The need for a higher or lower target INR must be assessed individually. Reference: The Pharmacology and Management of the Vitamin K  antagonists: the seventh ACCP Conference on Antithrombotic and Thrombolytic Therapy. UQJFH.5456 Sept:126 (3suppl): [id number]. A HCT value >55% may artifactually increase the PT.  In one study,  the increase was an average of 25%. Reference:  "Effect on Routine and Special Coagulation Testing Values of Citrate Anticoagulant Adjustment in Patients with High HCT Values." American Journal of Clinical Pathology 2006;126:400-405.)  Routine Hem:  22-Oct-15 15:42   WBC (CBC)  18.5  RBC (CBC) 4.79  Hemoglobin (CBC) 15.3  Hematocrit (CBC) 46.6  Platelet Count (CBC) 151 (Result(s) reported on 09 Feb 2014 at 04:02PM.)  MCV 97  MCH 32.0  MCHC 32.9  RDW  15.0   EKG:  EKG Interp. by me   Interpretation EKG shows atrial flutter, 79, nonspecific st/t changes Tele this AM with atrial flutter   Radiology Results: XRay:    22-Oct-15 16:36, Chest Portable Single View  Chest Portable Single View   REASON FOR EXAM:    chest pain  COMMENTS:       PROCEDURE: DXR - DXR PORTABLE CHEST SINGLE VIEW  - Feb 09 2014  4:36PM     CLINICAL DATA:  Chest pain.    EXAM:  PORTABLE CHEST - 1 VIEW    COMPARISON:  February 07, 2014.    FINDINGS:  Stable cardiomegaly. No  pneumothorax or pleural effusion is noted.  No acute pulmonary disease is noted. Bony thorax is intact.   IMPRESSION:  No acute cardiopulmonary abnormality seen.      Electronically Signed    By: Sabino Dick M.D.    On: 02/09/2014 16:42         Verified By: Marveen Reeks, M.D.,  Isosorbide Dinitrate: Hives  Codeine: Unknown  Nifedipine: Unknown  Vital Signs/Nurse's Notes: **Vital Signs.:   23-Oct-15 08:10  Vital Signs Type Routine  Temperature Temperature (F) 98.6  Celsius 37  Temperature Source oral  Pulse Pulse 81  Respirations Respirations 16  Systolic BP Systolic BP 170  Diastolic BP (mmHg) Diastolic BP (mmHg) 84  Mean BP 107  Pulse Ox % Pulse Ox % 98  Pulse Ox Activity Level  At rest  Oxygen Delivery Room Air/ 21 %    Impression 59 year old male with history of CAD, recently diagnosed paroxysmal atrial flutter in spring 2015, AML 5 weeks ago, nonsustained atrial tachycardia, prior CVA 2010, HTN, HLD, & IDDM who was admitted to [facility] 02/09/14 after presenting of our office to drop off a letter and was found to be quite SOB and fatigued. Since his admission he has complained of chest pain 6-10/10 with TnI negative x 5. He has remained in atrial flutter.  1. Chest pain:  -Per patient report he underwent Lexiscan Myoview in April 2015 which lead to a cardiac cath in May 2015 that showed small vessel disease (details unavailable) -He has known PAD, vertebral artery occlusion, likely high risk of CAD -Lexiscan Myoview ordered today given his continued chest pain this AM, 6/10. , 2 day study 2/2 to body habitus  -Check echo may be done inpatient or outpatient (he has an outpatient echo scheduled for the following week) -Optimal medical therapy - continue statin, ACEi, aspirin, add b-blocker post nuclear study (he needs to decrease sugary foods and drinks)  2. Atrial flutter: -History of paroxysmal atrial flutter - has remained in atrial flutter since 10/20 per his  report, but at least through out his admission -He did have a short run of tachycardia with rate of 140 bpm this AM associated with sx. Possible 2:1 flutter -He is not currently on any rate controlling agents -Add metoprolol 25 mg bid post Lexiscan Myoview -If tachycardia persists may need to add amiodarone -Follow up with Dr. Caryl Comes for possible ablation .  If we are able to obtain previous INRs, could consider cardioversion -TSH and Mg added   3. AML: -Followed at [facility] Needs rpeop clearance per the patient. STress test ordered  4. IDDM: -SSI   Electronic Signatures: Rise Mu (PA-C)  (Signed 23-Oct-15 10:14)  Authored: General Aspect/Present Illness, History and Physical Exam, Review of System, Family & Social History, Past Medical History, Home Medications, Labs, EKG , Radiology, Allergies, Vital Signs/Nurse's Notes, Impression/Plan Ida Rogue (MD)  (Signed 23-Oct-15 10:26)  Authored: General Aspect/Present Illness, History and Physical Exam, Review of System, Health Issues, Labs, EKG , Impression/Plan  Co-Signer: General Aspect/Present Illness, History and Physical Exam, Review of System, Family & Social History, Past Medical History, Home Medications, Labs, EKG , Radiology, Allergies, Vital Signs/Nurse's Notes, Impression/Plan   Last Updated: 23-Oct-15 10:26 by Ida Rogue (MD)

## 2014-08-12 NOTE — Discharge Summary (Signed)
PATIENT NAME:  Jeffrey Hancock, Jeffrey Hancock MR#:  [id number] DATE OF BIRTH:  1955-07-07  DATE OF ADMISSION:  02/09/2014 DATE OF DISCHARGE:  02/10/2014  PRESENTING COMPLAINT: Chest pain.   DISCHARGE DIAGNOSES:  1.  Chest pain, resolved.  2.  Chronic atrial flutter on Coumadin.  3.  History of acute myeloid leukemia.  4.  Type 2 diabetes.  5.  Morbid obesity.  6.  Code Status: Full code.   PROCEDURES:  Stress test part 1.  Stress test part 2 will be done on October 24 as outpatient.   MEDICATIONS: 1.  Amlodipine 10 mg daily.  2.  Aspirin 81 mg daily.  3.  Atenolol 50 mg daily.  4.  Atorvastatin 10 mg at bedtime.  5.  Duloxetine 40 mg daily.  6.  Lasix 80 mg daily.  7.  Lisinopril 40 mg daily.  8.  Oxycodone 5 mg 2 capsules per times a day.  9.  Trazodone 100 mg 1 tablet at bedtime.  10.  Warfarin 1-1/2 tablets daily on Monday, Wednesday, Friday, and 5 mg on Tuesday, Thursday, Saturday, Sunday.  11.  Potassium chloride 2 tablets once a day.  DIET: Low sodium, carbohydrate-controlled diet.   FOLLOWUP: With  Cardiology in 1 to 2 weeks.   FOLLOW-UP INSTRUCTIONS: For your second part stress test which needs to be completed as outpatient.   LABORATORY DATA:  Cardiac enzymes x 3 negative. White count on discharge is 15,000. TSH is 0.951. Magnesium is 1.9. Hemoglobin and hematocrit are 14.2 and 45.2. Chest x-Jakai: No acute cardiopulmonary abnormality. PT/INR is 27.6 and 2.7.   BRIEF SUMMARY OF HOSPITAL COURSE:  1.  [name] is a 59 year old obese Caucasian gentleman who was recently diagnosed who has history of atrial flutter on anticoagulation  with warfarin, insulin, recently diagnosed with AML, presented with chest pain. He was admitted with chest pain. He was started on IV heparin drip by ER physician. Nitroglycerin, aspirin, and statin were continue. Beta blockers continue. Dr. Rockey Situ saw patient. Recommended stress test with 2 parts. First part was completed. The second part; the patient will  complete it as outpatient since he was urging to be discharged. His cardiac enzymes remained negative.  2.  Atrial flutter. Chronic, rate controlled. INR is therapeutic. Resumed Coumadin.  3.  Type 2 diabetes, on insulin 70/30 according to sliding scale, which will be resumed at home.  4.  Hypertension. Continue Norvasc, atenolol, lisinopril.  5.  AML currently in preparation, stage IV treatment as he is following with cardiology. No treatment started thus far. CT chest showed possible right middle lobe pneumonia; however, the patient is asymptomatic. We will hold off on antibiotics. The patient was not keen on it, as well. The patient did not want to take any antibiotics. Elevated WBC could be due to AML. He remained afebrile and did not have any symptoms of pneumonia.   Hospital stay otherwise remained stable. Patient remained a full code.   TIME SPENT: 40 minutes.  ____________________________ Hart Rochester Posey Pronto, MD sap:am D: 02/10/2014 14:07:36 ET T: 02/11/2014 03:26:50 ET JOB#: [id number]  cc: Donavon Kimrey A. Posey Pronto, MD, <Dictator> Ilda Basset MD ELECTRONICALLY SIGNED 03/02/2014 7:46

## 2014-08-12 NOTE — Consult Note (Signed)
General Aspect Primary Cadiologist: Dr. Graciela Husbands, MD _______________________  59 year old male with history of CAD, recently diagnosed paroxysmal atrial flutter in spring 2015, AML 5 weeks ago, nonsustained atrial tachycardia, prior CVA 2010, HTN, HLD, & IDDM who was admitted to [facility] 02/09/14 for atruial flutter, presenting with malaise, nausea, tachycardia. ______________________  PMH: 1. CAD - lexiscan 07/2013, cath 08/2013 - small vessel disease at the [facility] 2. Recently diagnosed atrial flutter sping 2015 3. AML diagnosed 12/2013 (followed at [facility]) 4. Nonsustained atrial tachycardia 5. Prior CVA in 2010 6. HTN 7. HLD 8. IDDM _______________________   Present Illness 59 year old Caucasian male who presents to the Emergency Department complaining of nausea, tachycardia, chest tightness.  Sx began while he was mowing his yard and raking some leaves. The nausea  stopped once he stopped working and the patient ate supper but then sx recurred. Some tachycardia and tightness in his chest. 8/10 in severity.  pain was pinpoint and sharp, around the left nipple. The patient states "that it goes all the way through to the back." He denies that it is associated with respirations. Sx felt similar to his flutter.   ablation 1 week ago at [facility] for atrial flutter. Due to his persistent pain, the Emergency Department called for admission.   Patient with history of CAD with recent Lexiscan 07/2013 in April 2015 that lead to a cath in May 2015 that showed small vessel disease through the [facility]. He was recently diagnosed with paroxysmal atrial flutter in the spring of 2015. EKGs and recorders have shown nonsustained atrial tachycardia and frequent PACs. He is currently on xarelto for stroke prevention. 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   He was recently diagnosed with AML,  followed by [facility]. He will be on Imatinib. This has been delayed 2/2 his anticoagulation.   Physical Exam:  GEN well developed, well nourished, no acute distress, obese   HEENT hearing intact to voice, moist oral mucosa   NECK supple   RESP normal resp effort  clear BS   CARD Irregular rate and rhythm  Normal, S1, S2  No murmur   ABD denies tenderness  soft  normal BS   LYMPH negative neck   SKIN normal to palpation   NEURO motor/sensory function intact   PSYCH alert, A+O to time, place, person, good insight   Review of Systems:  Subjective/Chief Complaint Chest pain, palpitations   General: Fatigue  Weakness   Skin: No Complaints   ENT: No Complaints   Eyes: No Complaints   Neck: No Complaints   Respiratory: No Complaints   Cardiovascular: Chest pain or discomfort  Tightness  Palpitations  Dyspnea   Gastrointestinal: Nausea   Genitourinary: No Complaints   Vascular: No Complaints   Musculoskeletal: No Complaints   Neurologic: Dizzness  Fainting   Hematologic: No Complaints   Endocrine: No Complaints   Psychiatric: No Complaints   Review of Systems: All other systems were reviewed and found to be negative   Medications/Allergies Reviewed Medications/Allergies reviewed   Family & Social History:  Family and Social History:  Family History Coronary Artery Disease   Social History negative tobacco, negative ETOH, negative Illicit drugs   Place of Living Home     leukemia:    Multiple Sclerosis:    Dm:    CVA:    HTN:    gastric bypass:  Home Medications: Medication Instructions Status  amLODIPine 10 mg oral tablet 1 tab(s) orally once a day Active  atorvastatin 10 mg oral tablet 1 tab(s) orally once a day (at bedtime) Active  DULoxetine 40 mg oral delayed release capsule 1 cap(s) orally once a day Active  furosemide 80 mg oral tablet 1 tab(s) orally once a day Active  lisinopril 40 mg oral tablet 1 tab(s) orally once a day Active   oxyCODONE 5 mg oral capsule 2 cap(s) orally 2 times a day Active  traZODone 100 mg oral tablet 1 tab(s) orally once a day (at bedtime) Active  potassium chloride 2 tab(s) orally once a day Active  carvedilol 3.125 mg oral tablet 1 tab(s) orally 2 times a day Active  Xarelto 20 mg oral tablet 1 tab(s) orally once a day (in the evening) Active   Lab Results:  Hepatic:  05-Nov-15 20:26   Bilirubin, Total 0.5  Alkaline Phosphatase 75 (46-116 NOTE: New Reference Range 11/08/13)  SGPT (ALT) 21 (14-63 NOTE: New Reference Range 11/08/13)  SGOT (AST) 21  Total Protein, Serum 6.5  Albumin, Serum  2.9  Routine Chem:  05-Nov-15 20:26   Result Comment TROPONIN - RESULTS VERIFIED BY REPEAT TESTING.  - READ-BACK PROCESS PERFORMED.  - CALLED TO LEA FURGURSON AT 02/23/14 [place]  Result(s) reported on 23 Feb 2014 at 09:48PM.  Glucose, Serum  266  BUN 12  Creatinine (comp) 1.16  Sodium, Serum 140  Potassium, Serum 4.0  Chloride, Serum 106  CO2, Serum 29  Calcium (Total), Serum  8.3  Osmolality (calc) 288  eGFR (African American) >60  eGFR (Non-African American) >60 (eGFR values <68mL/min/1.73 m2 may be an indication of chronic kidney disease (CKD). Calculated eGFR, using the MRDR Study equation, is useful in  patients with stable renal function. The eGFR calculation will not be reliable in acutely ill patients when serum creatinine is changing rapidly. It is not useful in patients on dialysis. The eGFR calculation may not be applicable to patients at the low and high extremes of body sizes, pregnant women, and vegetarians.)  Anion Gap  5  Cardiac:  05-Nov-15 20:26   Troponin I 0.05 (0.00-0.05 0.05 ng/mL or less: NEGATIVE  Repeat testing in 3-6 hrs  if clinically indicated. >0.05 ng/mL: POTENTIAL  MYOCARDIAL INJURY. Repeat  testing in 3-6 hrs if  clinically indicated. NOTE: An increase or decrease  of 30% or more on serial  testing suggests a  clinically important change)   06-Nov-15 00:38   Troponin I 0.05 (0.00-0.05 0.05 ng/mL or less: NEGATIVE  Repeat testing in 3-6 hrs  if clinically indicated. >0.05 ng/mL: POTENTIAL  MYOCARDIAL INJURY. Repeat  testing in 3-6 hrs if  clinically indicated. NOTE: An increase or decrease  of 30% or more on serial  testing suggests a  clinically important change)    04:25   Troponin I 0.05 (0.00-0.05 0.05 ng/mL or less: NEGATIVE  Repeat testing in 3-6 hrs  if clinically indicated. >0.05 ng/mL: POTENTIAL  MYOCARDIAL INJURY. Repeat  testing in 3-6 hrs if  clinically indicated. NOTE: An increase or decrease  of 30% or more on serial  testing suggests a  clinically important change)  Routine Coag:  05-Nov-15 20:26   Prothrombin  21.9  INR 2.0 (INR reference interval applies to patients on anticoagulant therapy. A single INR therapeutic range for coumarins is not optimal for all indications; however, the suggested range for most indications is 2.0 - 3.0. Exceptions to the INR Reference Range may include:  Prosthetic heart valves, acute myocardial infarction, prevention of myocardial infarction, and combinations of aspirin and anticoagulant. The need for a higher or lower target INR must be assessed individually. Reference: The Pharmacology and Management of the Vitamin K  antagonists: the seventh ACCP Conference on Antithrombotic and Thrombolytic Therapy. QPYPP.5093 Sept:126 (3suppl): [id number]. A HCT value >55% may artifactually increase the PT.  In one study,  the increase was an average of 25%. Reference:  "Effect on Routine and Special Coagulation Testing Values of Citrate Anticoagulant Adjustment in Patients with High HCT Values." American Journal of Clinical Pathology 2006;126:400-405.)  Activated PTT (APTT) 33.2 (A HCT value >55% may artifactually increase the APTT. In one study, the increase was an average of 19%. Reference: "Effect on Routine and Special Coagulation Testing Values of Citrate  Anticoagulant Adjustment in Patients with High HCT Values." American Journal of Clinical Pathology 2006;126:400-405.)  Routine Hem:  05-Nov-15 20:26   WBC (CBC)  25.6  RBC (CBC)  3.91  Hemoglobin (CBC)  12.8  Hematocrit (CBC)  39.0  Platelet Count (CBC) 162 (Result(s) reported on 23 Feb 2014 at 09:33PM.)  MCV 100  MCH 32.8  MCHC 33.0  RDW  15.1   EKG:  EKG Interp. by me   Interpretation EKG shows NSR with rate 57 bpm, no significant ST or T wave changes tele showing no arrhythmia EMT strips with NSR   Radiology Results: XRay:    05-Nov-15 21:15, Chest Portable Single View  Chest Portable Single View   REASON FOR EXAM:    Chest Pain  COMMENTS:       PROCEDURE: DXR - DXR PORTABLE CHEST SINGLE VIEW  - Feb 23 2014  9:15PM     CLINICAL DATA:  Chest pain and difficulty breathing.    EXAM:  PORTABLE CHEST - 1 VIEW    COMPARISON:  02/09/2014    FINDINGS:  Shallow inspiration. Cardiac enlargement with mild central pulmonary  vascular congestion, increasing since prior study. No focal  consolidation or edema in the lungs. No blunting of costophrenic  angles. No pneumothorax.     IMPRESSION:  Cardiac enlargement with developing pulmonary vascular congestion  since previous study.      Electronically Signed    By: Lucienne Capers M.D.    On: 02/23/2014 21:36         Verified By: Neale Burly, M.D.,    Isosorbide Dinitrate: Hives  Codeine: Unknown  Nifedipine: Unknown  Vital Signs/Nurse's Notes: **Vital Signs.:   06-Nov-15 03:20  Vital Signs Type Admission  Temperature Temperature (F) 98.5  Celsius 36.9  Respirations Respirations 22  Systolic BP Systolic BP 267  Diastolic BP (mmHg) Diastolic BP (mmHg) 68  Mean BP 86  Pulse Ox % Pulse Ox % 94  Oxygen Delivery 2L    Impression HPI: Thorin Starner is a 59 y.o. male with a history of recently diagnosed leukemia, DM, HTN, CVA, HLD, PVD, OSA, obesity, CAD, GERD and PAF on Xarelto who presented to [facility] on  02/16/14 for planned atrial flutter RF ablation, discharged on flecainide 100 mg po BID, presenting with nausea and chest palpitations/tachycardia.   1) Nausea/fluttering/malaise likely from atrial flutter, brough on by raking leaves. no chest pain/angina concerning for ischemia, recent cath at the [facility], also recent stress test with no ischemia --will d/c heparin and NTG ggt ok to d/c home from cardiac perspective ---give NTG for chest pain/angina  2) Atrial flutter s/p RF ablation  on 02/16/14 which was initially successful.  However, he was  noted to go back into a slow atrial flutter on 02/17/14 around 11am started on flecainide 100 mg po BID, now NSR on arrival possible short run of atrial flutter yesterday after raking leaves. Suggested he take it easy for a while, would take extra flecainide for atrial flutter episodes. He is likely to have short paroxysmal epsiodes in the next three months   3) HTN-  started on hydralazine 25 bid at [facility] -- Continue coreg 12.$RemoveBeforeDEI'5mg'WGjVpyZLRUPUkSgq$  BID, Norvasc $RemoveBefo'10mg'cgkjXQwUgvi$  qd, lisinopril $RemoveBefore'40mg'eObhlDNVYyPhT$  daily  4) HLD-  continue statin  5) obesity: strict diet   6)AML we will need to talk with cancer doctor at [facility] He needs cleaerance per the patient to start treatment   Electronic Signatures: Ida Rogue (MD)  (Signed [phone] 08:43)  Authored: General Aspect/Present Illness, History and Physical Exam, Review of System, Family & Social History, Past Medical History, Home Medications, Labs, EKG , Radiology, Allergies, Vital Signs/Nurse's Notes, Impression/Plan   Last Updated: 06-Nov-15 08:43 by Ida Rogue (MD)

## 2014-08-12 NOTE — H&P (Signed)
PATIENT NAME:  Jeffrey Hancock, Jeffrey Hancock MR#:  [id number] DATE OF BIRTH:  08/06/1955  DATE OF ADMISSION:  02/09/2014  REFERRING PHYSICIAN: Briant Sites. Joni Fears, MD  PRIMARY CARE PHYSICIAN: Goes to the [place], follows with Dr. Caryl Comes of cardiology at [facility].   CHIEF COMPLAINT: Chest pain.   HISTORY OF PRESENT ILLNESS: A 59 year old Caucasian gentleman with a history of atrial flutter on warfarin for anticoagulation, recently diagnosed with acute myeloid leukemia as well as type 2 diabetes, insulin-requiring, uncomplicated, presenting with chest pain. The patient was to be evaluated by his cardiologist as an outpatient for preparation for AML treatment. However, before presenting to his office, he was walking around the store grocery shopping and had dyspnea on exertion with associated anginal symptoms, drenching sweats. The symptoms persisted on his arrival to the office, thus prompting Emergency Department visit. He has been complaining of chest pain with retrosternal location for the last 3- to 4-day duration, intermittent, mainly with minimal exertion, pressure in quality, 6 to 7 out of 10 in intensity without any relieving factors. However, today he noticed worsening symptoms, 10 out of 10 in intensity, radiation to the neck as well as shoulder, with associated shortness of breath and nausea. In the Emergency Department, he had some initial relief with nitroglycerin; however, then worsening symptoms. Thus hospitalist asked for an evaluation and admission. Currently still complaining of chest pain 8 out of 10. No further symptomatology at this time.  REVIEW OF SYSTEMS:  CONSTITUTIONAL: Denies fevers, chills. Positive for generalized fatigue, weakness as well as night sweats.  EYES: Denies blurry vision, double vision, eye pain.  EARS, NOSE, THROAT: Denies tinnitus, ear pain, hearing loss.  RESPIRATORY: Positive for shortness of breath as described above. Denies cough or wheeze.  CARDIOVASCULAR: Positive for  chest pain as described above. Denies any orthopnea, edema, palpitations. Positive for nausea. Denies vomiting, diarrhea, abdominal pain.  GENITOURINARY: Denies dysuria or hematuria. ENDOCRINE: Denies nocturia or thyroid problems. HEMATOLOGIC AND LYMPHATIC: Denies easy bruising, bleeding. SKIN: Denies rash or lesion.  MUSCULOSKELETAL: Denies pain in neck, back, shoulders, knees, hips or other arthritic symptoms.  NEUROLOGIC: Denies paralysis, paresthesias.  PSYCHIATRIC: Denies anxiety or depressive symptoms.  Otherwise, full review of systems performed by me is negative.   PAST MEDICAL HISTORY: Type 2 diabetes, insulin-requiring, uncomplicated, as well as recent diagnosis of AML, atrial flutter on warfarin for anticoagulation, hypertension.   SOCIAL HISTORY: Denies any alcohol, tobacco, or drug usage. He uses a scooter for mobility.   FAMILY HISTORY: Positive for coronary artery disease in his grandmother.   ALLERGIES: CODEINE, ISOSORBIDE DINITRATE, AND NIFEDIPINE.   HOME MEDICATIONS: Aspirin 81 mg p.o. q. daily; oxycodone 5 mg 2 capsules p.o. b.i.d.;  lisinopril 40 mg p.o. q. daily; warfarin 5 mg 1-1/2 tablets Monday, Wednesday, Friday, 5 mg 1 tablet Tuesday, Thursday, Saturday, Sunday; duloxetine 40 mg p.o. q. daily; trazodone 100 mg p.o. at bedtime; atorvastatin 10 mg p.o. at bedtime; atenolol 50 mg p.o. q. daily; Norvasc 10 mg p.o. q. daily; Lasix 80 mg p.o. q. daily; potassium 2 tablets p.o. q. daily of unknown dosage.   PHYSICAL EXAMINATION:  VITAL SIGNS: Temperature 98, heart rate 82, respirations 18, blood pressure 146/85, saturating 94% on room air. Weight 154.7 kg, BMI of 46.3.  GENERAL: Obese Caucasian gentleman in minimal to moderate distress given chest pain.  HEAD: Normocephalic, atraumatic.  EYES: Pupils equal, round, and reactive to light. Extraocular muscles intact. No scleral icterus. MOUTH: Moist mucosal membranes. Dentition intact. No abscess noted.  EARS, NOSE,  THROAT:  Clear, without exudates. No external lesions.  NECK: Supple. No thyromegaly. No nodules. No JVD.  PULMONARY: Clear to auscultation bilaterally without wheezes, rales, or rhonchi. No use of accessory muscles. Good respiratory effort. CHEST: Nontender to palpation.  CARDIOVASCULAR: S1 and S2, irregular rate, irregular rhythm. No murmurs, rubs, or gallops. No edema. Pedal pulses 2+ bilaterally. GASTROINTESTINAL: Soft, nontender, nondistended. No masses. Positive bowels sounds. No hepatosplenomegaly.   MUSCULOSKELETAL: No swelling, clubbing, or edema. Range of motion full in all extremities.  NEUROLOGIC: Cranial nerves II through XII intact. No gross focal neurologic deficits. Sensation intact. Reflexes intact. SKIN: No ulceration, lesions, rash, jaundice. Skin warm, dry. Turgor intact. PSYCHIATRIC: Mood and affect within normal limits. Awake, alert, oriented x 3. Insight and judgment intact.   LABORATORY DATA: Sodium of 138, potassium 3.9, chloride 100, bicarbonate 31, BUN 10, creatinine 1.10, glucose 174. LFTs: Albumin of 3.3, otherwise within normal limits. Troponin less than 0.02. WBC of 18.5, hemoglobin of 15.3, platelets of 151,000. INR of 2.7. Chest x-Nasser performed reveals no acute cardiopulmonary process.   ASSESSMENT AND PLAN: A 59 year old Caucasian gentleman with a history of atrial flutter on anticoagulation with warfarin; type 2 diabetes, insulin requiring, uncomplicated; as well as recently diagnosed with AML; presenting with chest pain. 1.  Unstable angina. Heparin drip initiated in the Emergency Department. Given continued pain, will admit to stepdown in case nitroglycerin drip is required. In addition to his p.r.n. nitroglycerin that he has been receiving, we will add p.r.n. pain medications, morphine, as he has actually not received any of this thus far. Initiate aspirin and statin therapy. Continue with beta blockade and trend cardiac enzymes x 3. Place on telemetry. Check transthoracic  echocardiogram unless recently done. Consult cardiology.  2.  Atrial flutter. Currently rate controlled. Therapeutic INR in the Emergency Department. Once again, he was started on heparin drip. We will continue this and hold warfarin for now.  3.  Type 2 diabetes, insulin requiring, uncomplicated. Place on insulin sliding scale with q. 6 hour Accu-Cheks.  4.  Hypertension. Continue Norvasc, atenolol, lisinopril.  5.  AML. Currently in preparation stage for treatment, as he is following with cardiology. No treatment started thus far.  6.  Venous thromboembolism. Prophylactic heparin drip.  CODE STATUS: The patient is full code.   TIME SPENT: 45 minutes.    ____________________________ Aaron Mose. Osha Rane, MD dkh:ST D: 02/09/2014 21:56:21 ET T: 02/09/2014 22:40:56 ET JOB#: [id number]  cc: Aaron Mose. Terisha Losasso, MD, <Dictator> Cheyenna Pankowski Woodfin Ganja MD ELECTRONICALLY SIGNED 02/10/2014 2:27

## 2014-08-12 NOTE — Discharge Summary (Signed)
PATIENT NAME:  Jeffrey Hancock, Jeffrey Hancock MR#:  [id number] DATE OF BIRTH:  [birth year]  DATE OF ADMISSION:  02/24/2014 DATE OF DISCHARGE:  02/24/2014  PRESENTING COMPLAINT: Chest pain and nausea.   DISCHARGE DIAGNOSES: 1. Breakthrough atrial flutter after ambulation.  2. History of acute myeloid leukemia.  3. Type 2 diabetes.  4. Morbid obesity.  5. Hypertension.  6. Hyperlipidemia.  7. History of coronary artery disease.  8. History of nonsustained atrial tachycardia.  9. History of cerebrovascular accident in 2010.  CONSULTATIONS: Dr. Ida Rogue, cardiology.   PROCEDURES: Chest x-Esmeralda on 02/23/2014 shows cardiac enlargement with developing pulmonary vascular congestion since prior study.   HISTORY OF PRESENT ILLNESS: This very pleasant, 59 year old man with past medical history of atrial flutter status post ablation 1 week prior to presentation, presents with nausea and mild chest pain, which started after doing yard work.   HOSPITAL COURSE: By problem:  1. Nausea, mild chest pain, palpitations: This is likely from breakthrough atrial flutter brought on by physical activity. There has not been any chest pain or angina. No elevation in troponins or EKG changes to suggest ischemia. Recent catheterization at the [facility] and recent stress test are both low risk. He was initially admitted to the ICU and put on a heparin drip due to concern of possible ischemia. He was seen by cardiology, Dr. Rockey Situ and heparin was discontinued. He was given nitroglycerin for chest pain/angina and he leaves with a prescription for sublingual nitroglycerin. He will follow up with his primary cardiologist at the [facility].  2. Atrial fibrillation, status post radiofrequency ablation on 02/16/2014, which was initially successful. He did go into slow atrial flutter on 02/17/2014 and was started on flecainide 100 mg p.o. b.i.d. Now he is in normal sinus rhythm. He may have had a short run of atrial flutter yesterday after physical activity.  He is advised to avoid strenuous exertion. He will continue on flecainide. He was advised by cardiology that he may continue to have short paroxysmal episodes of atrial flutter over the next few months. He was also started on Xarelto and given a coupon for this medication. 3. Hypertension: Blood pressures controlled at this time and he is discharged on Coreg 12.5 mg b.i.d., Norvasc 10 mg daily and lisinopril 40 mg daily.  4. Hyperlipidemia: He will continue on his statin.  5. Obesity. He was counseled regarding his weight. He has actually lost greater than 100 pounds over the past few years. He has had a bypass procedure. He was counseled regarding diet modification and will hopefully be able to return to physical activity soon.  6. Acute myeloid leukemia. He will continue to follow up at [facility]. He needs cardiac clearance prior to starting treatment. He will need to follow up with both cardiology and oncology.  DISCHARGE PHYSICAL EXAMINATION:  VITAL SIGNS: Temperature 98.7, pulse 62, respirations 16, blood pressure 108/84, oxygen saturation 97% on room air.  GENERAL: No acute distress, sitting up conversing, comfortable.  CARDIOVASCULAR: Regular rate and rhythm. No murmurs, rubs or gallops. Trace pedal edema. Peripheral pulses are 1+.  PULMONARY: Lungs are clear to auscultation bilaterally with good air movement.  ABDOMEN: Soft, nontender, nondistended, normal bowel sounds.   DISCHARGE LABORATORY DATA: Sodium 140, potassium 4.0, chloride 106, bicarbonate 29, BUN 12, creatinine 1.16, glucose 266 initially, decreasing to 186 by discharge. LFTs normal. Cardiac enzymes negative x 3. Hemoglobin 12.8, white blood cell count 25.6, platelets 162,000, MCV 100.   DISCHARGE MEDICATIONS: 1. Amlodipine 10 mg 1 tablet daily.  2.  Atorvastatin 10 mg 1 tablet daily.  3. Duloxetine 40 mg 1 tablet daily.  4. Furosemide 1 tablet daily.  5. Lisinopril 40 mg 1 tablet daily.  6. Oxycodone 5 mg 2 capsules orally twice a  day.  7. Trazodone 100 mg 1 tablet once a day at bedtime. 8. Potassium chloride 20 mEq 2 tablets once a day.  9. Xarelto 20 mg 1 tablet once a day in the evening.  10. Coreg 12.5 mg 1 tablet orally twice a day.  11. Flecainide 100 mg 1 tablet every 12 hours.  12. Nitroglycerin 40 mg sublingual every 5 minutes as needed for chest pain x 3. Please call your cardiologist should you experience additional chest pain.   CONDITION ON DISCHARGE: Stable.   DISPOSITION: Discharged to home. No home health needs.   DISCHARGE INSTRUCTIONS: DIET: Low-sodium, low-fat, low-cholesterol diet.  ACTIVITY: No strenuous exertion for the next few weeks.  FOLLOWUP: Within 1 to 2 weeks with Dr. Cleda Mccreedy in cardiology, follow up within 1 to 2 days with Dr. Mariea Clonts at the [facility]. The patient has already scheduled this appointment.   TIME SPENT ON DISCHARGE: 45 minutes.    ____________________________ Earleen Newport. Volanda Napoleon, MD cpw:TT D: 02/26/2014 16:58:39 ET T: 02/26/2014 17:16:29 ET JOB#: [id number]  cc: Barnetta Chapel P. Volanda Napoleon, MD, <Dictator> Aldean Jewett MD ELECTRONICALLY SIGNED 03/03/2014 11:44

## 2014-08-16 LAB — CULTURE, BLOOD (SINGLE)

## 2014-08-23 ENCOUNTER — Ambulatory Visit (INDEPENDENT_AMBULATORY_CARE_PROVIDER_SITE_OTHER): Payer: Non-veteran care | Admitting: Cardiovascular Disease

## 2014-08-23 ENCOUNTER — Telehealth: Payer: Self-pay

## 2014-08-23 ENCOUNTER — Encounter: Payer: Self-pay | Admitting: Cardiovascular Disease

## 2014-08-23 VITALS — BP 120/54 | HR 60 | Temp 98.3°F | Ht 72.0 in | Wt 349.5 lb

## 2014-08-23 DIAGNOSIS — I4892 Unspecified atrial flutter: Secondary | ICD-10-CM | POA: Diagnosis not present

## 2014-08-23 DIAGNOSIS — C9210 Chronic myeloid leukemia, BCR/ABL-positive, not having achieved remission: Secondary | ICD-10-CM

## 2014-08-23 DIAGNOSIS — E785 Hyperlipidemia, unspecified: Secondary | ICD-10-CM

## 2014-08-23 DIAGNOSIS — C9290 Myeloid leukemia, unspecified, not having achieved remission: Secondary | ICD-10-CM

## 2014-08-23 DIAGNOSIS — I5032 Chronic diastolic (congestive) heart failure: Secondary | ICD-10-CM

## 2014-08-23 DIAGNOSIS — R55 Syncope and collapse: Secondary | ICD-10-CM | POA: Diagnosis not present

## 2014-08-23 DIAGNOSIS — E119 Type 2 diabetes mellitus without complications: Secondary | ICD-10-CM | POA: Diagnosis not present

## 2014-08-23 DIAGNOSIS — I10 Essential (primary) hypertension: Secondary | ICD-10-CM

## 2014-08-23 DIAGNOSIS — I639 Cerebral infarction, unspecified: Secondary | ICD-10-CM

## 2014-08-23 NOTE — Patient Instructions (Signed)
Please monitor your blood pressure at home  Please hold the hydralazine  We will order a 30 day monitor for syncope, h/o of atrial flutter and atrial fibrillation  Please call us if you have new issues that need to be addressed before your next appt.  Your physician wants you to follow-up in: 6 to 7 weeks

## 2014-08-23 NOTE — Telephone Encounter (Signed)
-----   Message from Jonette Eva sent at 08/23/2014 12:30 PM EDT ----- Regarding: monitor Pt called stated he was approved to getting the monitor ordered from [place], he was just letting us know.

## 2014-08-23 NOTE — Telephone Encounter (Signed)
Please review message for monitor.

## 2014-08-23 NOTE — Assessment & Plan Note (Signed)
Recommended he continue his other medications including flecainide and carvedilol

## 2014-08-23 NOTE — Progress Notes (Signed)
Patient ID: Jeffrey Hancock, male    DOB: 09/21/1955, 59 y.o.   MRN: [id number]  HPI Comments: Jeffrey Hancock is a 59 y.o. male  with a prior history atrial flutter, diagnosed at [facility] in the [place] spring 2015, s/p catheter ablation October 8341,  complicated by atrial fibrillation. Started on flecainide and long-term anticoagulation with Rivaroxaban  (this was not covered by the [place]) he elected to take warfarin.  He has a history of a prior stroke hypertension and diabetes, obesity.  He has a history of leukemia; he is to be seen at the [facility]  He has a history of HFpEF with echocardiogram 10/15 demonstrated an EF of 55% He presents today for evaluation of recent episodes of syncope.  He reports that he has had 3 episodes of passing out. Symptoms have recurred past 3 weeks since mid April 2016. He reports that he was found twice on the bedroom floor by a nurse presented to check on him in the morning. He does not remember how he got there. One episode he fell into his bathtub, hit his head on spout. Most recent episode occurred Saturday night.  Primary care and amlodipine was held for low blood pressure. Was also recommended that he have a heart monitor.  Orthostatics done in the office today shows a drop in his blood pressure from 124/74 down to 113/70 with standing, increased heart rate 50s to 70s.  EKG on today's visit shows normal sinus rhythm with rate 60 bpm, intraventricular conduction delay  Other past history He was hospitalized at [facility] last week for chest pain and palpitations associated with exertion; he ruled out. No clear explanation was available.        Allergies  Allergen Reactions  . Codeine Swelling  . Isordil [Isosorbide] Hives  . Nifedipine Other (See Comments)    Headache     Current Outpatient Prescriptions on File Prior to Visit  Medication Sig Dispense Refill  . atorvastatin (LIPITOR) 10 MG tablet Take 10 mg by mouth at bedtime.     Marland Kitchen CALCIUM PO Take 1 tablet by  mouth 2 (two) times daily.    . carvedilol (COREG) 12.5 MG tablet Take 12.5 mg by mouth 2 (two) times daily with a meal.    . Cyanocobalamin (VITAMIN B-12 PO) Take 1 tablet by mouth 3 (three) times a week. Monday, Wednesday, Friday    . docusate sodium (COLACE) 100 MG capsule Take 400 mg by mouth at bedtime.    . DULoxetine (CYMBALTA) 20 MG capsule Take 40 mg by mouth daily.    . flecainide (TAMBOCOR) 100 MG tablet Take 1 tablet (100 mg total) by mouth 2 (two) times daily. 180 tablet 3  . hydrALAZINE (APRESOLINE) 25 MG tablet Take 25 mg by mouth 2 (two) times daily.    . insulin aspart protamine- aspart (NOVOLOG MIX 70/30) (70-30) 100 UNIT/ML injection Inject 35-50 Units into the skin 2 (two) times daily. Based on sugar levels    . insulin regular (NOVOLIN R,HUMULIN R) 100 units/mL injection Inject 8 Units into the skin daily before breakfast.    . IRON PO Take 1 tablet by mouth 4 (four) times a week. Sunday, Tuesday, Thursday, Saturday    . lisinopril (PRINIVIL,ZESTRIL) 40 MG tablet Take 40 mg by mouth daily.    Marland Kitchen oxyCODONE (OXY IR/ROXICODONE) 5 MG immediate release tablet Take 10 mg by mouth 3 (three) times daily. scheduled    . potassium chloride SA (K-DUR,KLOR-CON) 20 MEQ tablet Take 40 mEq  by mouth daily.     Marland Kitchen torsemide (DEMADEX) 20 MG tablet Take 40 mg by mouth daily.    . traZODone (DESYREL) 100 MG tablet Take 300 mg by mouth at bedtime.      No current facility-administered medications on file prior to visit.    Past Medical History  Diagnosis Date  . Hypertension   . Hyperlipidemia   . Diabetes mellitus without complication   . CVA (cerebral infarction)   . PVD (peripheral vascular disease)   . Atrial flutter, paroxysmal     a. s/p failed RF ablation and placed on flecainide   . OSA (obstructive sleep apnea)   . Acute leukemia   . Coronary artery disease   . GERD (gastroesophageal reflux disease)   . H/O hiatal hernia   . Obesity     Past Surgical History  Procedure  Laterality Date  . Gastric bypass    . Cardiac catheterization  07/2013  . Ablation of dysrhythmic focus  02/16/2014    ATRIAL FLUTTER       DR Caryl Comes   . Nasal sinus surgery    . Elbow surgery Right 1998  . Atrial flutter ablation N/A 02/16/2014    Procedure: ATRIAL FLUTTER ABLATION;  Surgeon: Deboraha Sprang, MD;  Location: [facility] CATH LAB;  Service: Cardiovascular;  Laterality: N/A;    Social History  reports that he quit smoking about 13 years ago. His smoking use included Cigarettes. He has a 10 pack-year smoking history. He has never used smokeless tobacco. He reports that he does not drink alcohol or use illicit drugs.  Family History Family history is unknown by patient.      Review of Systems  Constitutional: Negative.   Eyes: Negative.   Respiratory: Negative.   Cardiovascular: Negative.   Gastrointestinal: Negative.   Musculoskeletal: Negative.   Skin: Negative.   Neurological: Positive for syncope.  Hematological: Negative.   Psychiatric/Behavioral: Negative.   All other systems reviewed and are negative.   BP 120/54 mmHg  Pulse 60  Temp(Src) 98.3 F (36.8 C)  Ht 6' (1.829 m)  Wt 349 lb 8 oz (158.532 kg)  BMI 47.39 kg/m2  Physical Exam  Constitutional: He is oriented to person, place, and time. He appears well-developed and well-nourished.  Obese  HENT:  Head: Normocephalic.  Nose: Nose normal.  Mouth/Throat: Oropharynx is clear and moist.  Eyes: Conjunctivae are normal. Pupils are equal, round, and reactive to light.  Neck: Normal range of motion. Neck supple. No JVD present.  Cardiovascular: Normal rate, regular rhythm, S1 normal, S2 normal, normal heart sounds and intact distal pulses.  Exam reveals no gallop and no friction rub.   No murmur heard. Trace lower extremity edema above the sock line  Pulmonary/Chest: Effort normal and breath sounds normal. No respiratory distress. He has no wheezes. He has no rales. He exhibits no tenderness.  Abdominal:  Soft. Bowel sounds are normal. He exhibits no distension. There is no tenderness.  Musculoskeletal: Normal range of motion. He exhibits no edema or tenderness.  Lymphadenopathy:    He has no cervical adenopathy.  Neurological: He is alert and oriented to person, place, and time. Coordination normal.  Skin: Skin is warm and dry. No rash noted. No erythema.  Psychiatric: He has a normal mood and affect. His behavior is normal. Judgment and thought content normal.      Assessment and Plan   Nursing note and vitals reviewed.

## 2014-08-23 NOTE — Assessment & Plan Note (Signed)
Recommended he continue on his diuretic for now. We'll cut back on his blood pressure pills. If he has recurrent episodes of syncope, we'll check BMP.

## 2014-08-23 NOTE — Assessment & Plan Note (Signed)
Agree with holding amlodipine, would also hold hydralazine given his  recent syncope, borderline low blood pressure

## 2014-08-23 NOTE — Assessment & Plan Note (Signed)
Followed at [facility] 

## 2014-08-23 NOTE — Assessment & Plan Note (Signed)
We have encouraged continued careful diet management in an effort to lose weight.  

## 2014-08-23 NOTE — Assessment & Plan Note (Signed)
Cholesterol is at goal on the current lipid regimen. No changes to the medications were made.  

## 2014-08-23 NOTE — Telephone Encounter (Signed)
Pt's info entered into [employer].  They will contact pt today.

## 2014-08-23 NOTE — Telephone Encounter (Signed)
Please review for monitor placement.

## 2014-08-23 NOTE — Assessment & Plan Note (Signed)
Etiology of his syncope is unclear, concerning for orthostatic hypotension. Would agree with holding amlodipine. We've also recommended he hold his hydralazine as blood pressure continues to run borderline low. We will order a 30 day monitor to rule out underlying arrhythmia. He does have a history of atrial fibrillation, atrial flutter

## 2014-08-25 ENCOUNTER — Telehealth: Payer: Self-pay | Admitting: *Deleted

## 2014-08-25 NOTE — Telephone Encounter (Signed)
Received fax from [facility] that patient cancelled monitor 08/25/2014.

## 2014-08-28 ENCOUNTER — Encounter (INDEPENDENT_AMBULATORY_CARE_PROVIDER_SITE_OTHER): Payer: Non-veteran care

## 2014-08-28 DIAGNOSIS — R55 Syncope and collapse: Secondary | ICD-10-CM | POA: Diagnosis not present

## 2014-09-29 ENCOUNTER — Other Ambulatory Visit: Payer: Self-pay

## 2014-10-04 ENCOUNTER — Encounter: Payer: Self-pay | Admitting: Cardiovascular Disease

## 2014-10-04 ENCOUNTER — Ambulatory Visit (INDEPENDENT_AMBULATORY_CARE_PROVIDER_SITE_OTHER): Payer: Non-veteran care | Admitting: Cardiovascular Disease

## 2014-10-04 VITALS — BP 112/60 | HR 55 | Ht 73.0 in | Wt 345.5 lb

## 2014-10-04 DIAGNOSIS — I2510 Atherosclerotic heart disease of native coronary artery without angina pectoris: Secondary | ICD-10-CM | POA: Diagnosis not present

## 2014-10-04 DIAGNOSIS — I4892 Unspecified atrial flutter: Secondary | ICD-10-CM | POA: Diagnosis not present

## 2014-10-04 DIAGNOSIS — R55 Syncope and collapse: Secondary | ICD-10-CM

## 2014-10-04 DIAGNOSIS — E785 Hyperlipidemia, unspecified: Secondary | ICD-10-CM

## 2014-10-04 DIAGNOSIS — I10 Essential (primary) hypertension: Secondary | ICD-10-CM

## 2014-10-04 DIAGNOSIS — I639 Cerebral infarction, unspecified: Secondary | ICD-10-CM

## 2014-10-04 DIAGNOSIS — E119 Type 2 diabetes mellitus without complications: Secondary | ICD-10-CM

## 2014-10-04 NOTE — Assessment & Plan Note (Signed)
Maintaining normal sinus rhythm. No medication changes made Recent 30 day monitor. No significant arrhythmia noted

## 2014-10-04 NOTE — Assessment & Plan Note (Signed)
Cholesterol is at goal on the current lipid regimen. No changes to the medications were made.  

## 2014-10-04 NOTE — Assessment & Plan Note (Signed)
Currently with no symptoms of angina. No further workup at this time. Continue current medication regimen. 

## 2014-10-04 NOTE — Assessment & Plan Note (Signed)
Blood pressure between 120 and 130 at home. No changes made to his medications. We'll continue to hold his amlodipine and hydralazine

## 2014-10-04 NOTE — Patient Instructions (Addendum)
You are doing well. No medication changes were made.  Please call us if you have new issues that need to be addressed before your next appt.  Your physician wants you to follow-up in: 6 months.  You will receive a reminder letter in the mail two months in advance. If you don't receive a letter, please call our office to schedule the follow-up appointment.   

## 2014-10-04 NOTE — Assessment & Plan Note (Signed)
We have encouraged continued exercise, careful diet management in an effort to lose weight. 

## 2014-10-04 NOTE — Progress Notes (Signed)
Patient ID: Jeffrey Hancock, male    DOB: 11-26-1955, 59 y.o.   MRN: [id number]  HPI Comments: Jeffrey Hancock is a 59 y.o. male  with a prior history atrial flutter, diagnosed at [facility] in the [place] spring 2015, s/p catheter ablation October 5427,  complicated by atrial fibrillation. Started on flecainide and long-term anticoagulation with Rivaroxaban  (this was not covered by the [place]) he elected to take warfarin.  He has a history of a prior stroke hypertension and diabetes, obesity.  He has a history of leukemia; he is to be seen at the [facility]  He has a history of HFpEF with echocardiogram 10/15 demonstrated an EF of 55% He presents today for follow-up of episodes of syncope.  On his prior clinic visit, he has had 3 episodes of syncope. Amlodipine was held by primary care. We've recommended he hold hydralazine 30 day monitor was provided. The monitor showed no significant arrhythmia. He maintained normal sinus rhythm. Rare PVCs, APCs noted, sometimes with PVCs in a bigeminal pattern  In follow-up today he denies having any more syncope. He does have trouble with sleep. No lightheadedness or dizziness Overall feels that he is back to his baseline  EKG shows normal sinus rhythm with rate 55 bpm, no significant ST or T-wave changes  Other past medical history  3 episodes of passing out Symptoms have recurred past 3 weeks since mid April 2016. He reports that he was found twice on the bedroom floor by a nurse presented to check on him in the morning. He does not remember how he got there. One episode he fell into his bathtub, hit his head on spout. Most recent episode occurred Saturday night.  Orthostatics previously done in the office  showed a drop in his blood pressure from 124/74 down to 113/70 with standing, increased heart rate 50s to 70s.  He was hospitalized at [facility] for chest pain and palpitations associated with exertion; he ruled out. No clear explanation was available.        Allergies   Allergen Reactions  . Codeine Swelling  . Isordil [Isosorbide] Hives  . Nifedipine Other (See Comments)    Headache     Current Outpatient Prescriptions on File Prior to Visit  Medication Sig Dispense Refill  . atorvastatin (LIPITOR) 10 MG tablet Take 10 mg by mouth at bedtime.     Marland Kitchen CALCIUM PO Take 1 tablet by mouth 2 (two) times daily.    . carvedilol (COREG) 12.5 MG tablet Take 12.5 mg by mouth 2 (two) times daily with a meal.    . Cyanocobalamin (VITAMIN B-12 PO) Take 1 tablet by mouth 3 (three) times a week. Monday, Wednesday, Friday    . docusate sodium (COLACE) 100 MG capsule Take 400 mg by mouth at bedtime.    . DULoxetine (CYMBALTA) 20 MG capsule Take 40 mg by mouth daily.    . flecainide (TAMBOCOR) 100 MG tablet Take 1 tablet (100 mg total) by mouth 2 (two) times daily. 180 tablet 3  . insulin aspart protamine- aspart (NOVOLOG MIX 70/30) (70-30) 100 UNIT/ML injection Inject 35-50 Units into the skin 2 (two) times daily. Based on sugar levels    . insulin regular (NOVOLIN R,HUMULIN R) 100 units/mL injection Inject 18 Units into the skin daily before breakfast.     . IRON PO Take 1 tablet by mouth 4 (four) times a week. Sunday, Tuesday, Thursday, Saturday    . lisinopril (PRINIVIL,ZESTRIL) 40 MG tablet Take 40 mg by mouth  daily.    . oxyCODONE (OXY IR/ROXICODONE) 5 MG immediate release tablet Take 10 mg by mouth 3 (three) times daily. scheduled    . potassium chloride SA (K-DUR,KLOR-CON) 20 MEQ tablet Take 40 mEq by mouth daily.     Marland Kitchen torsemide (DEMADEX) 20 MG tablet Take 60 mg by mouth daily.     . traZODone (DESYREL) 100 MG tablet Take 300 mg by mouth at bedtime.      No current facility-administered medications on file prior to visit.    Past Medical History  Diagnosis Date  . Hypertension   . Hyperlipidemia   . Diabetes mellitus without complication   . CVA (cerebral infarction)   . PVD (peripheral vascular disease)   . Atrial flutter, paroxysmal     a. s/p failed RF  ablation and placed on flecainide   . OSA (obstructive sleep apnea)   . Acute leukemia   . Coronary artery disease   . GERD (gastroesophageal reflux disease)   . H/O hiatal hernia   . Obesity     Past Surgical History  Procedure Laterality Date  . Gastric bypass    . Cardiac catheterization  07/2013  . Ablation of dysrhythmic focus  02/16/2014    ATRIAL FLUTTER       DR Caryl Comes   . Nasal sinus surgery    . Elbow surgery Right 1998  . Atrial flutter ablation N/A 02/16/2014    Procedure: ATRIAL FLUTTER ABLATION;  Surgeon: Deboraha Sprang, MD;  Location: [facility] CATH LAB;  Service: Cardiovascular;  Laterality: N/A;    Social History  reports that he quit smoking about 13 years ago. His smoking use included Cigarettes. He has a 10 pack-year smoking history. He has never used smokeless tobacco. He reports that he does not drink alcohol or use illicit drugs.  Family History Family history is unknown by patient.   Review of Systems  Constitutional: Negative.   Eyes: Negative.   Respiratory: Negative.   Cardiovascular: Negative.   Gastrointestinal: Negative.   Musculoskeletal: Negative.   Skin: Negative.   Neurological: Negative.   Hematological: Negative.   Psychiatric/Behavioral: Positive for sleep disturbance.  All other systems reviewed and are negative.   BP 112/60 mmHg  Pulse 55  Ht 6\' 1"  (1.854 m)  Wt 345 lb 8 oz (156.718 kg)  BMI 45.59 kg/m2  Physical Exam  Constitutional: He is oriented to person, place, and time. He appears well-developed and well-nourished.  Obese  HENT:  Head: Normocephalic.  Nose: Nose normal.  Mouth/Throat: Oropharynx is clear and moist.  Eyes: Conjunctivae are normal. Pupils are equal, round, and reactive to light.  Neck: Normal range of motion. Neck supple. No JVD present.  Cardiovascular: Normal rate, regular rhythm, S1 normal, S2 normal, normal heart sounds and intact distal pulses.  Exam reveals no gallop and no friction rub.   No murmur  heard. Trace lower extremity edema above the sock line  Pulmonary/Chest: Effort normal and breath sounds normal. No respiratory distress. He has no wheezes. He has no rales. He exhibits no tenderness.  Abdominal: Soft. Bowel sounds are normal. He exhibits no distension. There is no tenderness.  Musculoskeletal: Normal range of motion. He exhibits no edema or tenderness.  Lymphadenopathy:    He has no cervical adenopathy.  Neurological: He is alert and oriented to person, place, and time. Coordination normal.  Skin: Skin is warm and dry. No rash noted. No erythema.  Psychiatric: He has a normal mood and affect. His behavior is  normal. Judgment and thought content normal.      Assessment and Plan   Nursing note and vitals reviewed.

## 2014-10-04 NOTE — Assessment & Plan Note (Signed)
Etiology of his syncope felt secondary to low blood pressure, orthostasis. No further symptoms after holding amlodipine and hydralazine

## 2014-10-15 ENCOUNTER — Inpatient Hospital Stay (HOSPITAL_COMMUNITY): Admit: 2014-10-15 | Payer: Self-pay | Source: Other Acute Inpatient Hospital | Admitting: Pulmonary Disease

## 2014-10-15 ENCOUNTER — Encounter: Payer: Self-pay | Admitting: Emergency Medicine

## 2014-10-15 ENCOUNTER — Emergency Department: Payer: Non-veteran care

## 2014-10-15 ENCOUNTER — Emergency Department
Admission: EM | Admit: 2014-10-15 | Discharge: 2014-10-20 | Disposition: E | Payer: Non-veteran care | Attending: Emergency Medicine | Admitting: Emergency Medicine

## 2014-10-15 DIAGNOSIS — S79912A Unspecified injury of left hip, initial encounter: Secondary | ICD-10-CM | POA: Diagnosis not present

## 2014-10-15 DIAGNOSIS — S99921A Unspecified injury of right foot, initial encounter: Secondary | ICD-10-CM | POA: Insufficient documentation

## 2014-10-15 DIAGNOSIS — R55 Syncope and collapse: Secondary | ICD-10-CM | POA: Diagnosis present

## 2014-10-15 DIAGNOSIS — Z794 Long term (current) use of insulin: Secondary | ICD-10-CM | POA: Diagnosis not present

## 2014-10-15 DIAGNOSIS — I459 Conduction disorder, unspecified: Secondary | ICD-10-CM | POA: Diagnosis not present

## 2014-10-15 DIAGNOSIS — N179 Acute kidney failure, unspecified: Secondary | ICD-10-CM | POA: Diagnosis not present

## 2014-10-15 DIAGNOSIS — E875 Hyperkalemia: Secondary | ICD-10-CM | POA: Insufficient documentation

## 2014-10-15 DIAGNOSIS — E119 Type 2 diabetes mellitus without complications: Secondary | ICD-10-CM | POA: Insufficient documentation

## 2014-10-15 DIAGNOSIS — Y9389 Activity, other specified: Secondary | ICD-10-CM | POA: Diagnosis not present

## 2014-10-15 DIAGNOSIS — W1839XA Other fall on same level, initial encounter: Secondary | ICD-10-CM | POA: Diagnosis not present

## 2014-10-15 DIAGNOSIS — I10 Essential (primary) hypertension: Secondary | ICD-10-CM | POA: Diagnosis not present

## 2014-10-15 DIAGNOSIS — Z79899 Other long term (current) drug therapy: Secondary | ICD-10-CM | POA: Diagnosis not present

## 2014-10-15 DIAGNOSIS — Y998 Other external cause status: Secondary | ICD-10-CM | POA: Diagnosis not present

## 2014-10-15 DIAGNOSIS — R001 Bradycardia, unspecified: Secondary | ICD-10-CM | POA: Insufficient documentation

## 2014-10-15 DIAGNOSIS — Y9289 Other specified places as the place of occurrence of the external cause: Secondary | ICD-10-CM | POA: Diagnosis not present

## 2014-10-15 DIAGNOSIS — Z87891 Personal history of nicotine dependence: Secondary | ICD-10-CM | POA: Insufficient documentation

## 2014-10-15 DIAGNOSIS — I469 Cardiac arrest, cause unspecified: Secondary | ICD-10-CM | POA: Insufficient documentation

## 2014-10-15 LAB — BLOOD GAS, ARTERIAL
ALLENS TEST (PASS/FAIL): POSITIVE — AB
Acid-base deficit: 17.5 mmol/L — ABNORMAL HIGH (ref 0.0–2.0)
BICARBONATE: 12.2 meq/L — AB (ref 21.0–28.0)
FIO2: 1 %
Mechanical Rate: 20
O2 SAT: 76.4 %
PCO2 ART: 42 mmHg (ref 32.0–48.0)
PH ART: 7.07 — AB (ref 7.350–7.450)
PO2 ART: 59 mmHg — AB (ref 83.0–108.0)
Patient temperature: 37

## 2014-10-15 LAB — BASIC METABOLIC PANEL
ANION GAP: 12 (ref 5–15)
BUN: 66 mg/dL — AB (ref 6–20)
CALCIUM: 8.3 mg/dL — AB (ref 8.9–10.3)
CO2: 15 mmol/L — ABNORMAL LOW (ref 22–32)
Chloride: 111 mmol/L (ref 101–111)
Creatinine, Ser: 6.75 mg/dL — ABNORMAL HIGH (ref 0.61–1.24)
GFR, EST AFRICAN AMERICAN: 9 mL/min — AB (ref >60)
GFR, EST NON AFRICAN AMERICAN: 8 mL/min — AB (ref >60)
Glucose, Bld: 226 mg/dL — ABNORMAL HIGH (ref 65–99)
Potassium: 7.5 mmol/L (ref 3.5–5.1)
Sodium: 138 mmol/L (ref 135–145)

## 2014-10-15 LAB — CBC WITH DIFFERENTIAL/PLATELET
BASOS PCT: 1 %
Basophils Absolute: 0.1 10*3/uL (ref 0–0.1)
Eosinophils Absolute: 0 10*3/uL (ref 0–0.7)
Eosinophils Relative: 0 %
HCT: 34.2 % — ABNORMAL LOW (ref 40.0–52.0)
Hemoglobin: 10.9 g/dL — ABNORMAL LOW (ref 13.0–18.0)
LYMPHS PCT: 14 %
Lymphs Abs: 1.8 10*3/uL (ref 1.0–3.6)
MCH: 33.6 pg (ref 26.0–34.0)
MCHC: 31.9 g/dL — ABNORMAL LOW (ref 32.0–36.0)
MCV: 105.4 fL — ABNORMAL HIGH (ref 80.0–100.0)
Monocytes Absolute: 0.4 10*3/uL (ref 0.2–1.0)
Monocytes Relative: 4 %
Neutro Abs: 10.6 10*3/uL — ABNORMAL HIGH (ref 1.4–6.5)
Neutrophils Relative %: 81 %
Platelets: 141 10*3/uL — ABNORMAL LOW (ref 150–440)
RBC: 3.24 MIL/uL — ABNORMAL LOW (ref 4.40–5.90)
RDW: 14.6 % — ABNORMAL HIGH (ref 11.5–14.5)
WBC: 13 10*3/uL — AB (ref 3.8–10.6)

## 2014-10-15 LAB — PROTIME-INR
INR: 1.1
PROTHROMBIN TIME: 14.4 s (ref 11.4–15.0)

## 2014-10-15 LAB — TROPONIN I: TROPONIN I: 0.03 ng/mL (ref <0.031)

## 2014-10-15 MED ORDER — DEXTROSE 5 % IV SOLN
2.0000 g | Freq: Once | INTRAVENOUS | Status: AC
  Administered 2014-10-15: 2 g via INTRAVENOUS
  Filled 2014-10-15: qty 2

## 2014-10-15 MED ORDER — SODIUM BICARBONATE 8.4 % IV SOLN
INTRAVENOUS | Status: AC
  Filled 2014-10-15: qty 50

## 2014-10-15 MED ORDER — EPINEPHRINE HCL 0.1 MG/ML IJ SOSY
PREFILLED_SYRINGE | INTRAMUSCULAR | Status: AC | PRN
  Administered 2014-10-15 (×2): 0.1 mg via INTRAVENOUS

## 2014-10-15 MED ORDER — ATROPINE SULFATE 0.1 MG/ML IJ SOLN
1.0000 mg | Freq: Once | INTRAMUSCULAR | Status: AC
Start: 2014-10-15 — End: 2014-10-15
  Administered 2014-10-15: 1 mg via INTRAVENOUS

## 2014-10-15 MED ORDER — SODIUM POLYSTYRENE SULFONATE 15 GM/60ML PO SUSP
30.0000 g | Freq: Once | ORAL | Status: AC
  Administered 2014-10-15: 30 g via RECTAL

## 2014-10-15 MED ORDER — AMIODARONE HCL IN DEXTROSE 360-4.14 MG/200ML-% IV SOLN
INTRAVENOUS | Status: AC
  Filled 2014-10-15: qty 200

## 2014-10-15 MED ORDER — SODIUM POLYSTYRENE SULFONATE 15 GM/60ML PO SUSP
ORAL | Status: AC
  Filled 2014-10-15: qty 120

## 2014-10-15 MED ORDER — SODIUM CHLORIDE 0.9 % IV SOLN
INTRAVENOUS | Status: AC | PRN
  Administered 2014-10-15: 1000 mL via INTRAVENOUS

## 2014-10-15 MED ORDER — CALCIUM CHLORIDE 10 % IV SOLN
INTRAVENOUS | Status: AC | PRN
  Administered 2014-10-15: 1 g via INTRAVENOUS

## 2014-10-15 MED ORDER — NOREPINEPHRINE 4 MG/250ML-% IV SOLN
INTRAVENOUS | Status: AC
  Filled 2014-10-15: qty 250

## 2014-10-15 MED ORDER — DEXTROSE 5 % IV SOLN
0.0000 ug/min | INTRAVENOUS | Status: DC
  Administered 2014-10-15: 8 ug/min via INTRAVENOUS

## 2014-10-15 MED ORDER — CALCIUM CHLORIDE 10 % IV SOLN
INTRAVENOUS | Status: AC
  Filled 2014-10-15: qty 10

## 2014-10-15 MED ORDER — NOREPINEPHRINE 4 MG/250ML-% IV SOLN: 0.0000 ug/min | INTRAVENOUS | Status: DC

## 2014-10-15 MED ORDER — SODIUM BICARBONATE 8.4 % IV SOLN
INTRAVENOUS | Status: AC | PRN
  Administered 2014-10-15: 50 meq via INTRAVENOUS

## 2014-10-15 MED ORDER — INSULIN ASPART 100 UNIT/ML ~~LOC~~ SOLN
SUBCUTANEOUS | Status: AC
  Filled 2014-10-15: qty 10

## 2014-10-15 MED ORDER — PROPOFOL 1000 MG/100ML IV EMUL
INTRAVENOUS | Status: AC
  Filled 2014-10-15: qty 100

## 2014-10-15 MED ORDER — STERILE WATER FOR INJECTION IV SOLN
Freq: Once | INTRAVENOUS | Status: DC
  Filled 2014-10-15: qty 850

## 2014-10-15 MED FILL — Medication: Qty: 1 | Status: AC

## 2014-10-16 LAB — COMPREHENSIVE METABOLIC PANEL
ALBUMIN: 3.3 g/dL — AB (ref 3.5–5.0)
ALK PHOS: 43 U/L (ref 38–126)
ALT: 14 U/L — AB (ref 17–63)
ANION GAP: 13 (ref 5–15)
AST: 30 U/L (ref 15–41)
BILIRUBIN TOTAL: 0.7 mg/dL (ref 0.3–1.2)
BUN: 74 mg/dL — ABNORMAL HIGH (ref 6–20)
CHLORIDE: 105 mmol/L (ref 101–111)
CO2: 16 mmol/L — ABNORMAL LOW (ref 22–32)
Calcium: 8.2 mg/dL — ABNORMAL LOW (ref 8.9–10.3)
Creatinine, Ser: 7.63 mg/dL — ABNORMAL HIGH (ref 0.61–1.24)
GFR calc Af Amer: 8 mL/min — ABNORMAL LOW (ref >60)
GFR, EST NON AFRICAN AMERICAN: 7 mL/min — AB (ref >60)
Glucose, Bld: 142 mg/dL — ABNORMAL HIGH (ref 65–99)
Potassium: >7.5 mmol/L (ref 3.5–5.1)
SODIUM: 134 mmol/L — AB (ref 135–145)
TOTAL PROTEIN: 5.8 g/dL — AB (ref 6.5–8.1)

## 2014-10-16 LAB — GLUCOSE, CAPILLARY: Glucose-Capillary: 133 mg/dL — ABNORMAL HIGH (ref 65–99)

## 2014-10-20 NOTE — Code Documentation (Signed)
IO in left tibia

## 2014-10-20 NOTE — ED Provider Notes (Signed)
[facility] Emergency Department Provider Note  ____________________________________________  Time seen: Approximately 5:56 AM  I have reviewed the triage vital signs and the nursing notes.   HISTORY  Chief Complaint Fall  Limited by altered mental status  HPI Jeffrey Hancock is a 59 y.o. male who arrives via EMS from home s/p unwitnessed fall, likely syncopal episode. Patient lives by himself. Does not remember fall; remembers waking up on the floor and crawling to the telephone to call 911. Complains of left hip pain and right great toe pain. History is otherwise limited by patient's decreased level of consciousness.   Past Medical History  Diagnosis Date  . Hypertension   . Hyperlipidemia   . Diabetes mellitus without complication   . CVA (cerebral infarction)   . PVD (peripheral vascular disease)   . Atrial flutter, paroxysmal     a. s/p failed RF ablation and placed on flecainide   . OSA (obstructive sleep apnea)   . Acute leukemia   . Coronary artery disease   . GERD (gastroesophageal reflux disease)   . H/O hiatal hernia   . Obesity     Patient Active Problem List   Diagnosis Date Noted  . Syncope 08/23/2014  . Chronic diastolic CHF (congestive heart failure) 08/23/2014  . TIA (transient ischemic attack) 04/24/2014  . CML (chronic myeloid leukemia) 04/24/2014  . Weakness 04/23/2014  . Left-sided weakness 04/23/2014  . PVD (peripheral vascular disease)   . Atrial flutter, paroxysmal   . H/O hiatal hernia   . Essential hypertension 02/17/2014  . GERD (gastroesophageal reflux disease)   . CVA (cerebral infarction)   . Acute leukemia   . Coronary artery disease   . Hypertension   . Hyperlipidemia   . Diabetes mellitus without complication   . Obesity   . OSA (obstructive sleep apnea)   . Atrial flutter 02/16/2014    Past Surgical History  Procedure Laterality Date  . Gastric bypass    . Cardiac catheterization  07/2013  . Ablation  of dysrhythmic focus  02/16/2014    ATRIAL FLUTTER       DR Caryl Comes   . Nasal sinus surgery    . Elbow surgery Right 1998  . Atrial flutter ablation N/A 02/16/2014    Procedure: ATRIAL FLUTTER ABLATION;  Surgeon: Deboraha Sprang, MD;  Location: [facility] CATH LAB;  Service: Cardiovascular;  Laterality: N/A;    Current Outpatient Rx  Name  Route  Sig  Dispense  Refill  . atorvastatin (LIPITOR) 10 MG tablet   Oral   Take 10 mg by mouth at bedtime.          Marland Kitchen CALCIUM PO   Oral   Take 1 tablet by mouth 2 (two) times daily.         . carvedilol (COREG) 12.5 MG tablet   Oral   Take 12.5 mg by mouth 2 (two) times daily with a meal.         . Cyanocobalamin (VITAMIN B-12 PO)   Oral   Take 1 tablet by mouth 3 (three) times a week. Monday, Wednesday, Friday         . docusate sodium (COLACE) 100 MG capsule   Oral   Take 400 mg by mouth at bedtime.         . DULoxetine (CYMBALTA) 20 MG capsule   Oral   Take 40 mg by mouth daily.         . flecainide (TAMBOCOR) 100 MG tablet  Oral   Take 1 tablet (100 mg total) by mouth 2 (two) times daily.   180 tablet   3   . insulin aspart protamine- aspart (NOVOLOG MIX 70/30) (70-30) 100 UNIT/ML injection   Subcutaneous   Inject 35-50 Units into the skin 2 (two) times daily. Based on sugar levels         . insulin regular (NOVOLIN R,HUMULIN R) 100 units/mL injection   Subcutaneous   Inject 18 Units into the skin daily before breakfast.          . IRON PO   Oral   Take 1 tablet by mouth 4 (four) times a week. Sunday, Tuesday, Thursday, Saturday         . lisinopril (PRINIVIL,ZESTRIL) 40 MG tablet   Oral   Take 40 mg by mouth daily.         Marland Kitchen oxyCODONE (OXY IR/ROXICODONE) 5 MG immediate release tablet   Oral   Take 10 mg by mouth 3 (three) times daily. scheduled         . potassium chloride SA (K-DUR,KLOR-CON) 20 MEQ tablet   Oral   Take 40 mEq by mouth daily.          Marland Kitchen torsemide (DEMADEX) 20 MG tablet   Oral    Take 60 mg by mouth daily.          . traZODone (DESYREL) 100 MG tablet   Oral   Take 300 mg by mouth at bedtime.            Allergies Codeine; Isordil; and Nifedipine  Family History  Problem Relation Age of Onset  . Family history unknown: Yes    Social History History  Substance Use Topics  . Smoking status: Former Smoker -- 1.00 packs/day for 10 years    Types: Cigarettes    Quit date: 02/07/2001  . Smokeless tobacco: Never Used  . Alcohol Use: No    Review of Systems Constitutional: No fever/chills Eyes: No visual changes. ENT: No sore throat. Cardiovascular: Denies chest pain. Respiratory: Denies shortness of breath. Gastrointestinal: No abdominal pain.  No nausea, no vomiting.  No diarrhea.  No constipation. Genitourinary: Negative for dysuria. Musculoskeletal: Positive for right toe pain and left hip pain. Negative for back pain. Skin: Negative for rash. Neurological: Negative for headaches, focal weakness or numbness.  Limited by decreased level of consciousness. 10-point ROS otherwise negative.  ____________________________________________   PHYSICAL EXAM:  VITAL SIGNS: ED Triage Vitals  Enc Vitals Group     BP 2014-11-01 0536 93/55 mmHg     Pulse Rate 2014-11-01 0536 40     Resp 11/01/14 0536 10     Temp 11-01-2014 0536 98.4 F (36.9 C)     Temp Source 11-01-2014 0536 Oral     SpO2 2014/11/01 0536 91 %     Weight 11-01-14 0536 350 lb (158.759 kg)     Height 2014-11-01 0536 6\' 5"  (1.956 m)     Head Cir --      Peak Flow --      Pain Score --      Pain Loc --      Pain Edu? --      Excl. in [place]? --     Constitutional: Somnolent, ill-appearing in moderate acute distress. Eyes: Conjunctivae are normal. PERRL. EOMI. Head: Atraumatic. Nose: No congestion/rhinnorhea. Mouth/Throat: Mucous membranes are dry.  Oropharynx non-erythematous. Neck: No stridor. No cervical spine tenderness to palpation. Cardiovascular: Bradycardiac, muffled heart sounds.  Thready peripheral  pulses. Respiratory: Mildly diminished respiratory effort.  No retractions. Lungs slightly CTAB. Gastrointestinal: Obese. Soft and nontender. No distention. No abdominal bruits. No CVA tenderness. Musculoskeletal: Right toe abrasion. Unable to elicit pain on palpation either hip. Pelvis stable. Neurologic:  Somnolent, briefly opens eyes to voice. Follows simple commands to to grip. Equal bilateral grips.  Skin:  Skin is pale  Psychiatric: Mood and affect are normal. Speech and behavior are normal.  ____________________________________________   LABS (all labs ordered are listed, but only abnormal results are displayed)  Labs Reviewed  CBC WITH DIFFERENTIAL/PLATELET - Abnormal; Notable for the following:    WBC 13.0 (*)    RBC 3.24 (*)    Hemoglobin 10.9 (*)    HCT 34.2 (*)    MCV 105.4 (*)    MCHC 31.9 (*)    RDW 14.6 (*)    Platelets 141 (*)    Neutro Abs 10.6 (*)    All other components within normal limits  COMPREHENSIVE METABOLIC PANEL - Abnormal; Notable for the following:    Sodium 134 (*)    Potassium >7.5 (*)    CO2 16 (*)    Glucose, Bld 142 (*)    BUN 74 (*)    Creatinine, Ser 7.63 (*)    Calcium 8.2 (*)    Total Protein 5.8 (*)    Albumin 3.3 (*)    ALT 14 (*)    GFR calc non Af Amer 7 (*)    GFR calc Af Amer 8 (*)    All other components within normal limits  BLOOD GAS, ARTERIAL - Abnormal; Notable for the following:    pH, Arterial 7.07 (*)    pO2, Arterial 59 (*)    Bicarbonate 12.2 (*)    Acid-base deficit 17.5 (*)    Allens test (pass/fail) POSITIVE (*)    All other components within normal limits  BASIC METABOLIC PANEL - Abnormal; Notable for the following:    Potassium 7.5 (*)    CO2 15 (*)    Glucose, Bld 226 (*)    BUN 66 (*)    Creatinine, Ser 6.75 (*)    Calcium 8.3 (*)    GFR calc non Af Amer 8 (*)    GFR calc Af Amer 9 (*)    All other components within normal limits  TROPONIN I  PROTIME-INR    ____________________________________________  EKG  ED ECG REPORT I, Mckay Brandt J, the attending physician, personally viewed and interpreted this ECG.   Date: 27-Oct-2014  EKG Time: 0536  Rate: 38  Rhythm: Bradycardic, heart block  Axis: LAD  Intervals:right bundle branch block  ST&T Change: Intraventricular conduction delay, widened QRS  ____________________________________________  RADIOLOGY  Portable chest x-Winter (viewed by me, interpreted by Dr. Radene Knee): 1. Endotracheal tube seen ending 2 cm above the carina. 2. Vascular congestion and mild cardiomegaly, with patchy bilateral airspace opacities. This may reflect multifocal pneumonia or possibly pulmonary edema. ____________________________________________   PROCEDURES  Procedure(s) performed: INTUBATION Performed by: Paulette Blanch  Required items: required blood products, implants, devices, and special equipment available Patient identity confirmed: provided demographic data and hospital-assigned identification number Time out: Immediately prior to procedure a "time out" was called to verify the correct patient, procedure, equipment, support staff and site/side marked as required.  Indications: Cardiopulmonary arrest   Intubation method: Glidescope Laryngoscopy   Preoxygenation: BVM  Sedatives: None Paralytic: None   Tube Size: 8-0 cuffed  Post-procedure assessment: chest rise and ETCO2 monitor Breath sounds: equal and absent over  the epigastrium Tube secured with: ETT holder Chest x-Nazair interpreted by radiologist and me.  Chest x-Eamon findings: endotracheal tube in appropriate position  Patient tolerated the procedure well with no immediate complications. Patient had evidence of emesis in his oropharynx which was suctioned. Vocal cords were visualized via glide scope and ET tube placed without difficulty.      Critical Care performed: CRITICAL CARE Performed by: Paulette Blanch   Total critical care time:  120 minutes  Critical care time was exclusive of separately billable procedures and treating other patients.  Critical care was necessary to treat or prevent imminent or life-threatening deterioration.  Critical care was time spent personally by me on the following activities: development of treatment plan with patient and/or surrogate as well as nursing, discussions with consultants, evaluation of patient's response to treatment, examination of patient, obtaining history from patient or surrogate, ordering and performing treatments and interventions, ordering and review of laboratory studies, ordering and review of radiographic studies, pulse oximetry and re-evaluation of patient's condition.   ____________________________________________   INITIAL IMPRESSION / ASSESSMENT AND PLAN / ED COURSE  Pertinent labs & imaging results that were available during my care of the patient were reviewed by me and considered in my medical decision making (see chart for details).  59 year old male who presents via EMS s/p fall, questionable syncopal episode. Patient is somnolent, ill appearing and quite bradycardic on arrival. Review of patient's chart reveals that he was recently seen by his cardiologist Dr. Rockey Situ on June 15 in follow-up for syncopal episodes. Chart states patient is taking flecainide and Coumadin. Given patient's decreased level of consciousness, will obtain CT head to evaluate for ICH. Will give atropine for bradycardia. Will contact cardiology for probable transfer to [facility] for urgent pacemaker placement. Code cart pulled to bedside. Patient placed on pacer pads.  65 Spoke with Dr. Angelena Form (cardiology) who viewed patient's EKG and agrees with transfer for urgent pacemaker placement. Advised me to contact [facility] who will contact interventional radiologist on-call for direct transfer to Cath Lab. Atropine was given which increased patient's heart rate to 66.  0630 Patient became  bradycardic to the 20s and lost his pulse while in CT scan. CT head unable to be obtained secondary to cardiopulmonary arrest. This was a witnessed arrest by the nurse who accompanied patient to CT scan. CPR was immediately started and patient BVM. Patient was immediately transported back to the ED, intubated without difficulty and code blue initiated (see nursing flow sheet). ROSC after several rounds of epi, atropine, bicarbonate, calcium, magnesium, amiodarone bolus and drip. Cardiac activity noted on bedside ultrasound. Given recent severe bradycardia leading to cardiac arrest, I have placed him on transcutaneous pacer at a rate of 70 with good capture. Laboratory called with a critical potassium >7.5 and a creatinine >7. Patient does not have a history of renal failure, and last creatinine in April was approximately 2. Additional bicarbonate, insulin, dextrose and rectal Kayexalate given. Spoke with Dr. Tamala Julian (interventional cardiology) and updated him of patient's ED course. After our discussion, given patient's metabolic status, I will contact the medical ICU for transfer. Dr. Tamala Julian will be available for urgent pacemaker placement as needed. IV antibiotics ordered for probable aspiration and postarrest status.  0638 Discussed case with Dr. Halford Chessman [facility] ICU) who has accepted the patient in transfer. [facility] to transport. Charge nurse notified patient's son via telephone who will proceed to [facility].   [phone] Spoke with Dr. Gennaro Africa (ICU) who recommends bicarbonate drip, increase ventilatory  rate to 24 and repeat electrolytes. Patient hypotensive with a systolic blood  pressure of 79. Will initiate Levophed drip. Palpable right femoral pulse.  [place] arrived; at patient's bedside to transport.  0810 Shortly after [facility] arrived, patient's end-tidal CO2 became undetectable and he subsequently lost his pulse. Chest compressions resumed, additional rounds of epi and bicarbonate given.  Unable to regain spontaneous return of circulation, no cardiac activity noted on bedside ultrasound, patient remained in asystole on the cardiac monitor. Time of death pronounced at 67. I have notified patient's son via telephone who is arriving to the hospital soon.  0838 I have spoken with patient's son in person in the family room. Son states patient was not feeling well yesterday and complained of blurry vision. Chaplain with patient and will accompany him until other family members arrive. Will prepare the patient for viewing by family. ____________________________________________   FINAL CLINICAL IMPRESSION(S) / ED DIAGNOSES  Final diagnoses:  Hyperkalemia  Cardiac arrest  Bradycardia  Heart block  Syncope and collapse  Acute renal failure, unspecified acute renal failure type      Paulette Blanch, MD November 14, 2014 [phone]

## 2014-10-20 NOTE — Code Documentation (Addendum)
Pt taken to CT w/ 2 RNs on [place]oll, brady down, given 0.1 atropine w/o effect, compressions started, code called, see paper chart for code details

## 2014-10-20 NOTE — ED Notes (Signed)
samsung smart phone in black colored phone case placed in valuables envelope to be placed in hospital safe by security.

## 2014-10-20 NOTE — ED Notes (Signed)
Pt to rm 18 via EMS from home.  EMS reports unwitnessed fall.  Pt c/o right hip pain and blood coming from right great toe.  Abrasions noted to right great toe.  Pt states he does not remember anything of fall.  Pt speaking softly, slow to respond.  Pt NAD at this time.

## 2014-10-20 NOTE — ED Provider Notes (Signed)
-----------------------------------------   6:34 AM on 2014/11/12 -----------------------------------------  I assisted Dr. Beather Arbour during the patient's code.  I initially placed an IO in his left tibia with successful drawback and good flush of normal saline.  I then placed a right femoral central line triple lumen catheter as indicated below.  CENTRAL LINE Performed by: Hinda Kehr Consent: The procedure was performed in an emergent situation. Required items: required blood products, implants, devices, and special equipment available Patient identity confirmed: arm band and provided demographic data Indications: vascular access Patient sedated: no, CPR in progress Hand hygiene: hand hygiene performed prior to central venous catheter insertion  Location details: R femoral  Catheter type: triple lumen Catheter size: 8 Fr Pre-procedure: landmarks identified Ultrasound guidance: no Successful placement: yes Post-procedure: line sutured and dressing applied Assessment: blood return through all parts, free fluid flow Complication:  I initially hit the femoral artery with my needle as indicated by pulsatile blood flow during CPR.  I immediately withdrew the needle and applied pressure briefly before moving medially and proceeding with essential line procedure due to the emergent need for additional vascular access.  The dilator was not introduced into the artery.  Hinda Kehr, MD 11/12/2014 [phone]

## 2014-10-20 NOTE — ED Notes (Signed)
Dr. Beather Arbour at bedside, pads placed.

## 2014-10-20 NOTE — Progress Notes (Signed)
   08-Nov-2014 0830  Clinical Encounter Type  Visited With Family  Visit Type ED;Death  Consult/Referral To Chaplain  Spiritual Encounters  Spiritual Needs Grief support;Prayer  Provided pastoral presence, prayer and support to step son of patient and his wife.  Took over from on call chaplain down in ED. Pt was deceased.  No other family members present at this time and others are too far away to come at this time.  Said a prayer with the step son and his wife at patient's bedside in ED.  Stayed with family until they left the ED.  Jeffrey Hancock-pager [phone]

## 2014-10-20 DEATH — deceased

## 2015-03-31 IMAGING — CT CT ANGIO CHEST
2 of 6 series · 18 of 46 positions shown · IV contrast (isovue)
Comparison: Single view of the chest earlier this same day and
11/04/2013.

CLINICAL DATA: Shortness of Breath the left anterior chest pain.
History of leukemia. Question pulmonary embolus.

EXAM:
CT ANGIOGRAPHY CHEST WITH CONTRAST
TECHNIQUE: Multidetector CT imaging of the chest was performed using the
standard protocol during bolus administration of intravenous
contrast. Multiplanar CT image reconstructions and MIPs were
obtained to evaluate the vascular anatomy.
CONTRAST:  100 cc Isovue 370.

[Series 5: pe thins 1.5 · axial · 0.68mm/px · z∈[-56,+206]mm · 15 of 244 slices shown]
[im 13/244  lung]
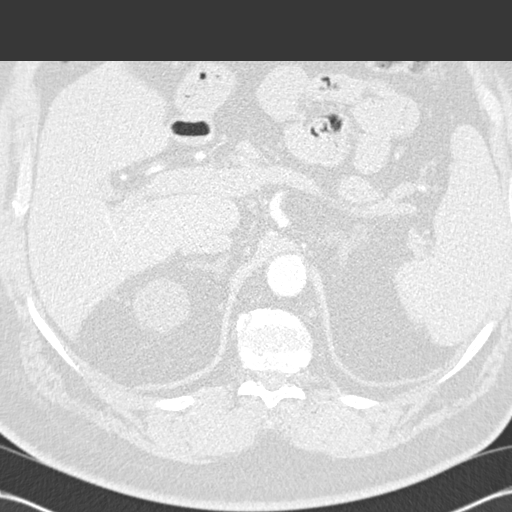
[im 26/244  soft-tissue]
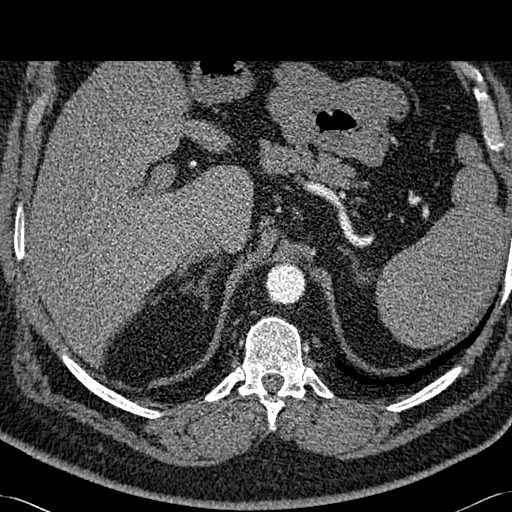
[im 52/244  lung]
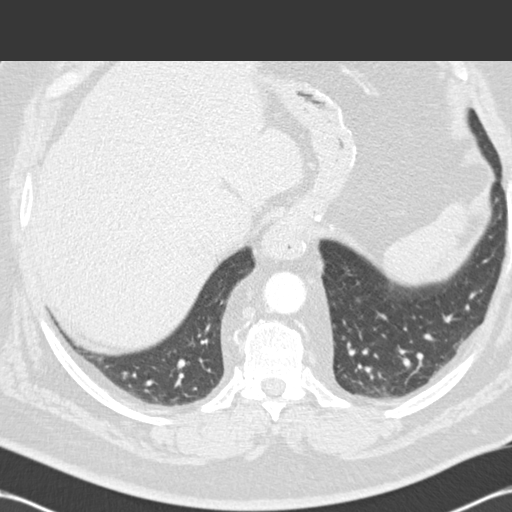
[im 64/244  soft-tissue]
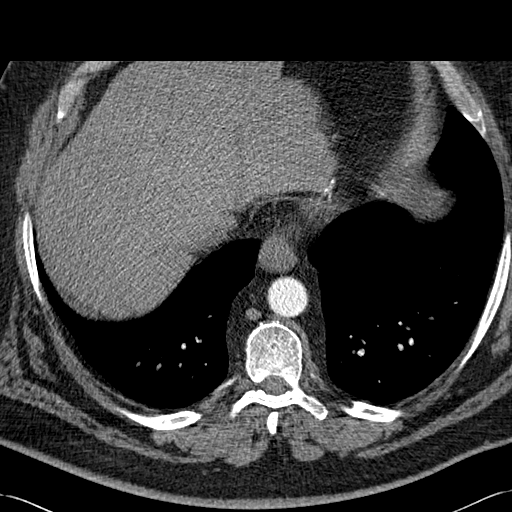
[im 77/244  lung]
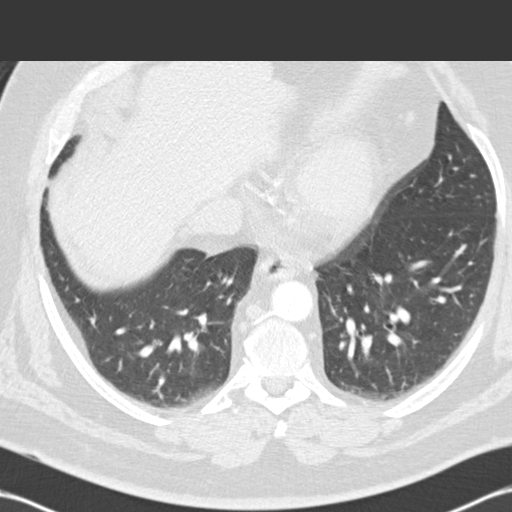
[im 90/244  soft-tissue]
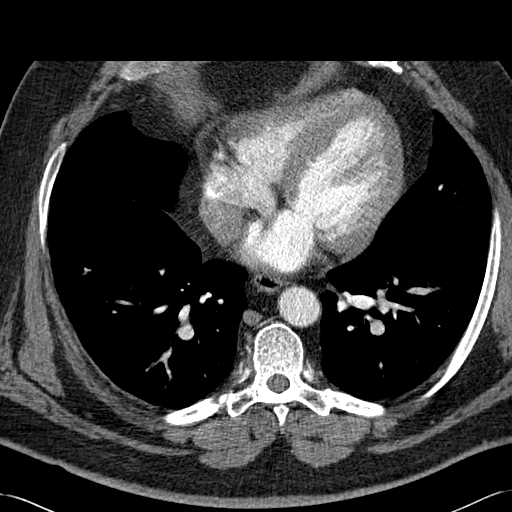
[im 103/244  lung]
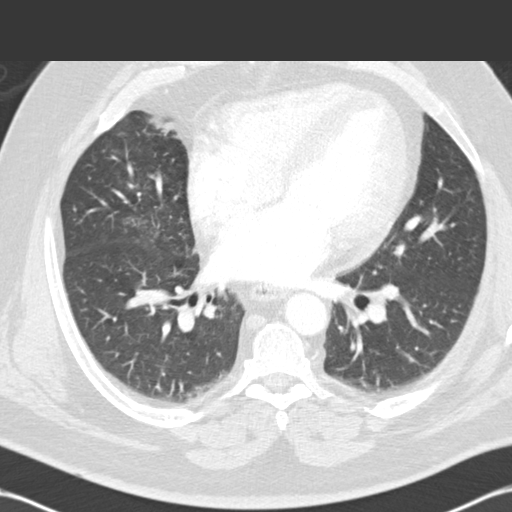
[im 128/244  soft-tissue]
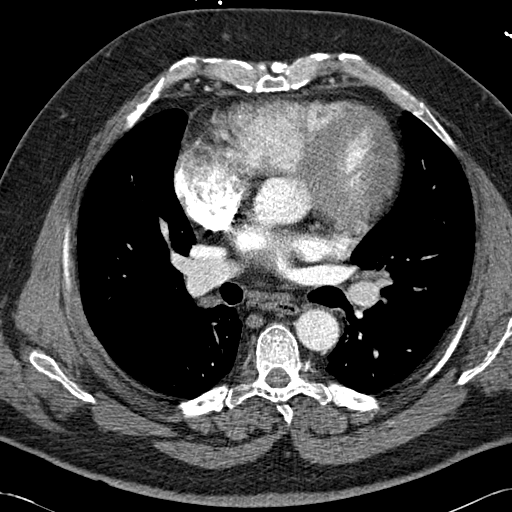
[im 141/244  lung]
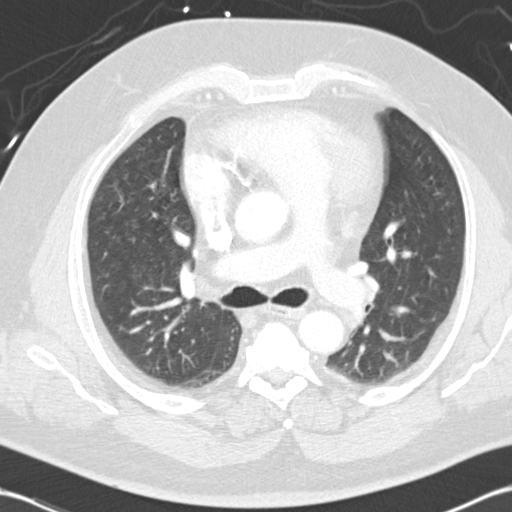
[im 154/244  soft-tissue]
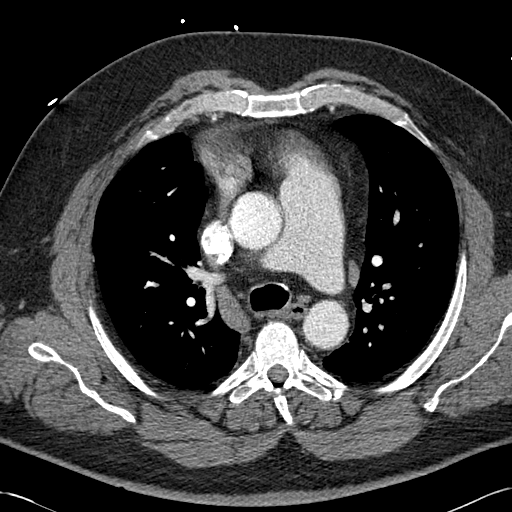
[im 167/244  lung]
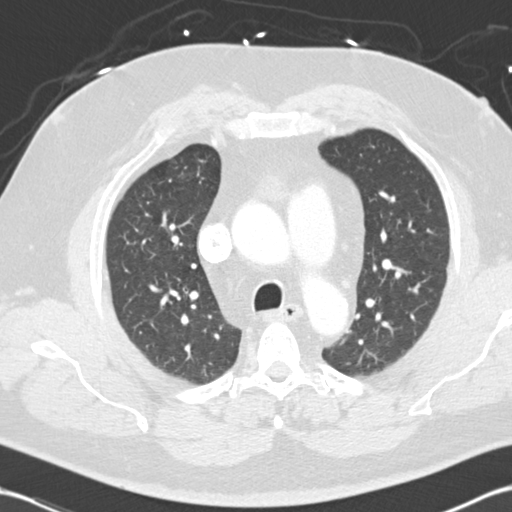
[im 180/244  soft-tissue]
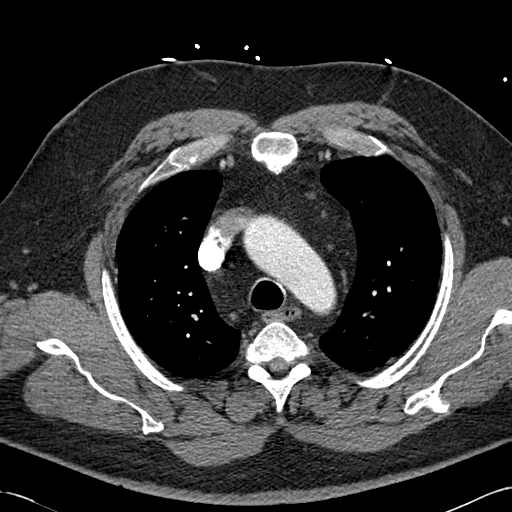
[im 205/244  lung]
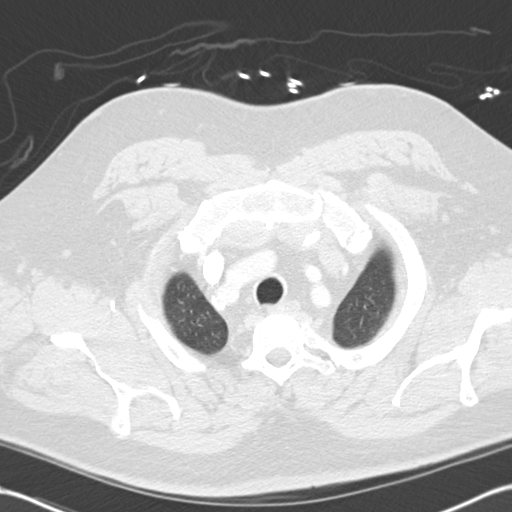
[im 218/244  soft-tissue]
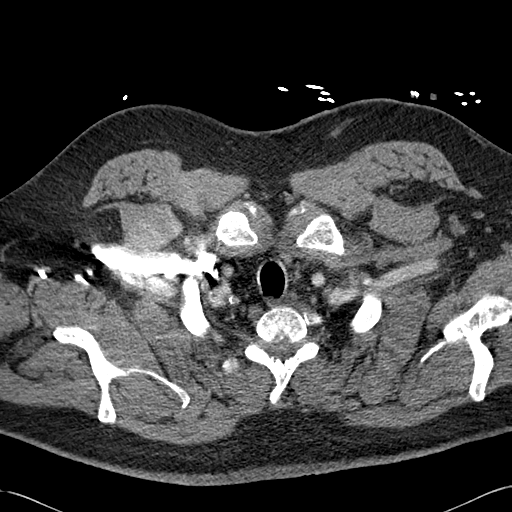
[im 231/244  lung]
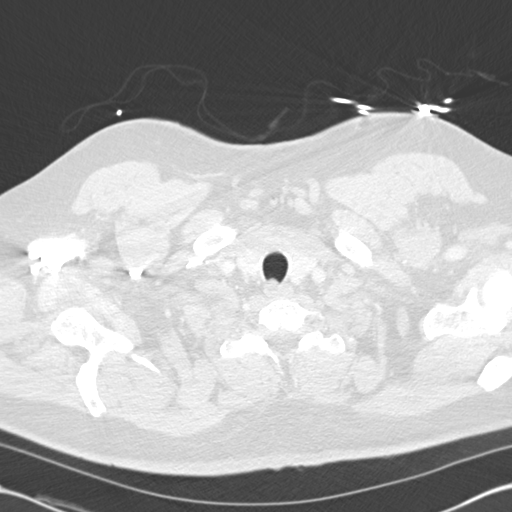

[Series 7: cor mpr 2.0 · coronal · 0.58mm/px · 3 of 156 slices shown]
[im 39/156  soft-tissue]
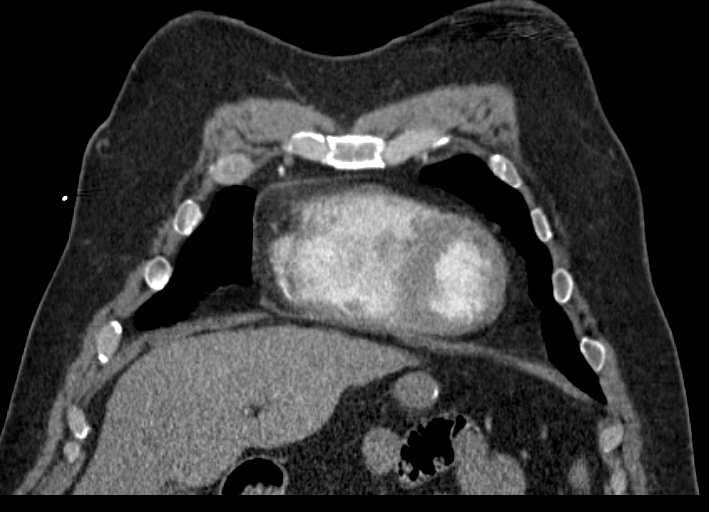
[im 78/156  soft-tissue]
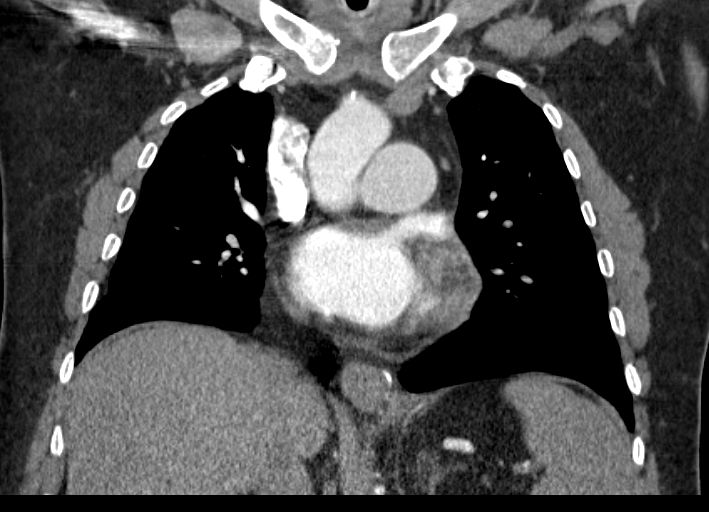
[im 117/156  soft-tissue]
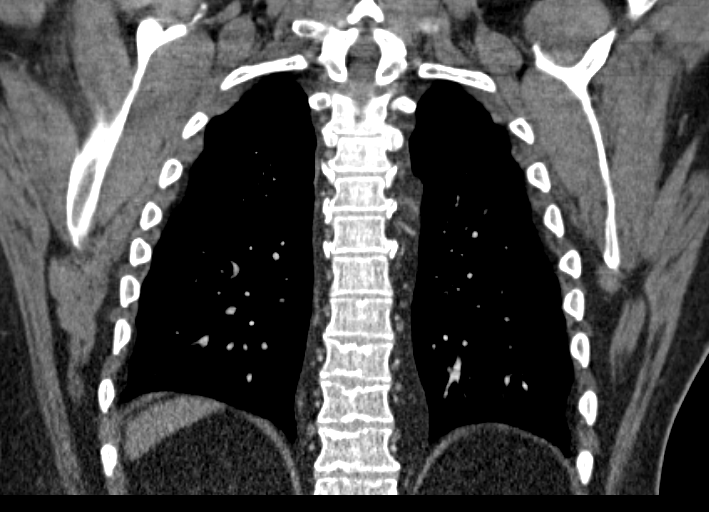

[18 of 46 positions shown; findings below may reference images not displayed]

FINDINGS: Although somewhat limited by bolus timing, no pulmonary embolus is
identified. There is cardiomegaly. No pleural or pericardial
effusion. There is no axillary, hilar or mediastinal
lymphadenopathy. The lungs demonstrate mild, patchy airspace opacity
in the right middle lobe. Mild dependent atelectasis is also seen.

Incidentally imaged upper abdomen demonstrates postoperative change
of the stomach. Cyst in the upper pole of the right kidney is also
noted. There is no focal bony abnormality.

Review of the MIP images confirms the above findings.
IMPRESSION: Negative for pulmonary embolus.

Mild, patchy airspace opacity in the right middle lobe is worrisome
for bronchopneumonia.

Cardiomegaly.
